# Patient Record
Sex: Female | Born: 1985 | Race: Black or African American | Hispanic: No | Marital: Married | Smoking: Former smoker
Health system: Southern US, Community
[De-identification: ages and names within clinical notes are randomized; demographics above are authoritative.]

## PROBLEM LIST (undated history)

## (undated) DIAGNOSIS — O24419 Gestational diabetes mellitus in pregnancy, unspecified control: Secondary | ICD-10-CM

## (undated) DIAGNOSIS — F329 Major depressive disorder, single episode, unspecified: Secondary | ICD-10-CM

## (undated) DIAGNOSIS — O139 Gestational [pregnancy-induced] hypertension without significant proteinuria, unspecified trimester: Secondary | ICD-10-CM

## (undated) DIAGNOSIS — F32A Depression, unspecified: Secondary | ICD-10-CM

## (undated) HISTORY — DX: Gestational diabetes mellitus in pregnancy, unspecified control: O24.419

## (undated) HISTORY — DX: Depression, unspecified: F32.A

## (undated) HISTORY — DX: Gestational (pregnancy-induced) hypertension without significant proteinuria, unspecified trimester: O13.9

## (undated) HISTORY — DX: Major depressive disorder, single episode, unspecified: F32.9

---
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
## 2007-01-24 ENCOUNTER — Emergency Department (HOSPITAL_COMMUNITY): Admission: EM | Admit: 2007-01-24 | Discharge: 2007-01-24 | Payer: Self-pay | Admitting: Emergency Medicine

## 2007-05-29 ENCOUNTER — Emergency Department (HOSPITAL_COMMUNITY): Admission: EM | Admit: 2007-05-29 | Discharge: 2007-05-29 | Payer: Self-pay | Admitting: Emergency Medicine

## 2007-07-22 ENCOUNTER — Emergency Department (HOSPITAL_COMMUNITY): Admission: EM | Admit: 2007-07-22 | Discharge: 2007-07-22 | Payer: Self-pay | Admitting: Emergency Medicine

## 2007-09-22 ENCOUNTER — Emergency Department (HOSPITAL_COMMUNITY): Admission: EM | Admit: 2007-09-22 | Discharge: 2007-09-23 | Payer: Self-pay | Admitting: Emergency Medicine

## 2008-03-18 ENCOUNTER — Emergency Department (HOSPITAL_COMMUNITY): Admission: EM | Admit: 2008-03-18 | Discharge: 2008-03-18 | Payer: Self-pay | Admitting: Emergency Medicine

## 2008-04-26 ENCOUNTER — Ambulatory Visit: Payer: Self-pay | Admitting: Internal Medicine

## 2008-04-26 ENCOUNTER — Observation Stay (HOSPITAL_COMMUNITY): Admission: EM | Admit: 2008-04-26 | Discharge: 2008-04-27 | Payer: Self-pay | Admitting: Emergency Medicine

## 2010-06-08 LAB — URINALYSIS, ROUTINE W REFLEX MICROSCOPIC
Bilirubin Urine: NEGATIVE
Glucose, UA: NEGATIVE mg/dL
Ketones, ur: NEGATIVE mg/dL
Protein, ur: NEGATIVE mg/dL
Specific Gravity, Urine: 1.017 (ref 1.005–1.030)
Urobilinogen, UA: 0.2 mg/dL (ref 0.0–1.0)
pH: 7 (ref 5.0–8.0)

## 2010-06-08 LAB — BLOOD GAS, ARTERIAL
Acid-base deficit: 1.5 mmol/L (ref 0.0–2.0)
Bicarbonate: 22.3 mEq/L (ref 20.0–24.0)
Drawn by: 277341

## 2010-06-08 LAB — BASIC METABOLIC PANEL
CO2: 26 mEq/L (ref 19–32)
CO2: 27 mEq/L (ref 19–32)
Calcium: 7.6 mg/dL — ABNORMAL LOW (ref 8.4–10.5)
Calcium: 7.9 mg/dL — ABNORMAL LOW (ref 8.4–10.5)
Calcium: 8.3 mg/dL — ABNORMAL LOW (ref 8.4–10.5)
GFR calc Af Amer: >60 mL/min (ref >60)
GFR calc Af Amer: >60 mL/min (ref >60)
GFR calc non Af Amer: >60 mL/min (ref >60)
Glucose, Bld: 102 mg/dL — ABNORMAL HIGH (ref 70–99)
Glucose, Bld: 92 mg/dL (ref 70–99)
Glucose, Bld: 97 mg/dL (ref 70–99)
Potassium: 2.8 mEq/L — ABNORMAL LOW (ref 3.5–5.1)
Potassium: 3.2 mEq/L — ABNORMAL LOW (ref 3.5–5.1)
Potassium: 3.5 mEq/L (ref 3.5–5.1)
Sodium: 137 mEq/L (ref 135–145)
Sodium: 139 mEq/L (ref 135–145)
Sodium: 141 mEq/L (ref 135–145)

## 2010-06-08 LAB — CBC
HCT: 41.7 % (ref 36.0–46.0)
Hemoglobin: 11.7 g/dL — ABNORMAL LOW (ref 12.0–15.0)
MCHC: 33.3 g/dL (ref 30.0–36.0)
MCHC: 33.8 g/dL (ref 30.0–36.0)
MCV: 90.9 fL (ref 78.0–100.0)
MCV: 91.3 fL (ref 78.0–100.0)
WBC: 7.3 10*3/uL (ref 4.0–10.5)

## 2010-06-08 LAB — RAPID URINE DRUG SCREEN, HOSP PERFORMED
Amphetamines: NOT DETECTED
Benzodiazepines: NOT DETECTED
Opiates: POSITIVE — AB
Tetrahydrocannabinol: POSITIVE — AB

## 2010-06-08 LAB — DIFFERENTIAL
Basophils Relative: 0 % (ref 0–1)
Eosinophils Absolute: 0.2 10*3/uL (ref 0.0–0.7)
Eosinophils Relative: 2 % (ref 0–5)
Lymphs Abs: 0.9 10*3/uL (ref 0.7–4.0)
Monocytes Absolute: 0.5 10*3/uL (ref 0.1–1.0)
Monocytes Relative: 7 % (ref 3–12)
Neutrophils Relative %: 78 % — ABNORMAL HIGH (ref 43–77)

## 2010-06-08 LAB — COMPREHENSIVE METABOLIC PANEL
AST: 19 U/L (ref 0–37)
Alkaline Phosphatase: 71 U/L (ref 39–117)
BUN: 11 mg/dL (ref 6–23)
CO2: 26 mEq/L (ref 19–32)
Calcium: 8.9 mg/dL (ref 8.4–10.5)
Creatinine, Ser: 0.98 mg/dL (ref 0.4–1.2)
GFR calc Af Amer: >60 mL/min (ref >60)
GFR calc non Af Amer: >60 mL/min (ref >60)
Glucose, Bld: 100 mg/dL — ABNORMAL HIGH (ref 70–99)
Sodium: 142 mEq/L (ref 135–145)
Total Protein: 7.6 g/dL (ref 6.0–8.3)

## 2010-06-08 LAB — POCT I-STAT 3, ART BLOOD GAS (G3+)
pCO2 arterial: 31.7 mmHg — ABNORMAL LOW (ref 35.0–45.0)
pH, Arterial: 7.418 — ABNORMAL HIGH (ref 7.350–7.400)

## 2010-06-08 LAB — OSMOLALITY: Osmolality: 287 mOsm/kg (ref 275–300)

## 2010-06-08 LAB — TRICYCLICS SCREEN, URINE: TCA Scrn: NOT DETECTED

## 2010-07-14 NOTE — Discharge Summary (Signed)
Carrie Haley, Carrie Haley                  ACCOUNT NO.:  [id number]   MEDICAL RECORD NO.:  [id number]          PATIENT TYPE:  OBV   LOCATION:  6713                         FACILITY:  [facility]   PHYSICIAN:  Madaline Guthrie, M.D.    DATE OF BIRTH:  Oct 08, 1985   DATE OF ADMISSION:  04/26/2008  DATE OF DISCHARGE:  04/27/2008                               DISCHARGE SUMMARY   DISCHARGE DIAGNOSES:  1. Salicylate intoxication.  2. Dysmenorrhea.  3. Hypertension, taken off meds because the blood pressure normalized.   DISCHARGE MEDICATIONS:  None.   CONDITION AT DISCHARGE:  The patient did not have any adverse effects of  salicylate intoxication.  The patient was instructed on proper usage of  aspirin.  We will have the patient follow up at [facility].  The patient will be called with appointment.   PROCEDURES:  None.   HISTORY OF PRESENT ILLNESS:  A 25 year old woman with past medical  history of hypertension and dysmenorrhea admitted via the ED for aspirin  overdose.  The patient was in her usual state of health until day before  admission during the afternoon when she developed right-sided headache.  At that time, the patient had been drinking 24 ounces of beer.  The  patient decided to start taking aspirin 325 mg for relief of headache.  The patient is unable to recall how many aspirin she took.  The patient  went to bed.  Upon awakening up on the morning of admission, the patient  was experiencing ringing of her ears and nausea.  The patient vomited  once, bringing up food, no blood.  Also, noticed epigastric pain,  burning in nature, nonradiating.  No bowel-related problems such as  diarrhea.  No shortness of breath.  No chest pain.  No headaches on  admission.  No seizures.  No loss of consciousness.  The patient denied  suicidal intention.   PHYSICAL EXAMINATION:  VITAL SIGNS:  Temperature 96.9, blood pressure  156/87, pulse 90, respiratory rate 16, and oxygen saturation 99  on room  air.  GENERAL:  NAD.  EYES:  EOMI, PERRLA, no icterus.  ENT:  Moist mucous membranes.  NECK:  Supple.  RESPIRATORY:  Clear to auscultation bilaterally.  CARDIOVASCULAR:  Regular rate and rhythm.  No murmurs, rubs, or gallops.  GI:  Soft.  Bowel sounds positive.  Nontender and nondistended.  EXTREMITIES:  No edema.  GENITOURINARY:  No CVA tenderness bilaterally.  SKIN:  Normal turgor.  No rash.  LYMPH NODES:  No lymphadenopathy.  MUSCULOSKELETAL:  Normal range of motions.  NEUROLOGICAL:  Alert and oriented x3, nonfocal.   ADMISSION LABORATORY DATA:  Sodium 142, potassium 3.8, chloride 108,  bicarb 26, BUN 11, creatinine 0.98, and glucose 100.  White blood cells  7.3, hemoglobin 13.9, and platelets 389.  Salicylate level 34.3.   HOSPITAL COURSE:  1. Salicylate overdose.  The patient was admitted for close      observation.  The patient was treated with activated charcoal and      alkalinization of urine.  The patient's epigastric pain was  appropriately managed with pain medications.  The patient did not      develop any acidosis or renal failure due to salicylate      intoxication.  Electrolytes were monitored throughout the      hospitalization and showed that potassium was low and thus she was      given appropriate replenishment of potassium which corrected by the      time the patient was discharged.  On the day of discharge, the      patient's salicylate level came down to 14.5.  The patient was      instructed on proper usage of aspirin.  2. Depression.  Although the patient states not to have any suicidal      intentions, the patient did experience some depression.  The      patient did not have any plans of suicide.  The patient did not      want to talk to a professional about depression.  The patient      agreed that she would look for help as an outpatient basis.  The      patient will continue to follow up at [facility] for      further  management.  3. Tobacco abuse.  Smoking cessation consult was done while during the      hospitalization.  The patient was recommended to stop cigarette      smoking and consequences of such were explained.   DISCHARGE LABORATORY DATA:  White blood cells 6.5, hemoglobin 11.7,  hematocrit 34.6, and platelets 314.  Sodium 139, potassium 3.5, chloride  108, bicarb 27, glucose 96, BUN 6, and creatinine 0.77.   DISCHARGE VITAL SIGNS:  Temperature 97.7, blood pressure 133/71, pulse  76, respiratory rate 18, and oxygen saturation 98 on room air.      [name], MD  Electronically Signed      Madaline Guthrie, M.D.  Electronically Signed    RV/MEDQ  D:  04/29/2008  T:  04/30/2008  Job:  [id number]

## 2010-09-29 ENCOUNTER — Ambulatory Visit (INDEPENDENT_AMBULATORY_CARE_PROVIDER_SITE_OTHER): Payer: Self-pay | Admitting: Obstetrics & Gynecology

## 2010-09-29 ENCOUNTER — Encounter: Payer: Self-pay | Admitting: Obstetrics & Gynecology

## 2010-09-29 ENCOUNTER — Other Ambulatory Visit: Payer: Self-pay | Admitting: Obstetrics & Gynecology

## 2010-09-29 VITALS — BP 143/80 | HR 73 | Temp 97.9°F | Ht 66.0 in | Wt 221.0 lb

## 2010-09-29 DIAGNOSIS — L680 Hirsutism: Secondary | ICD-10-CM

## 2010-09-29 LAB — FOLLICLE STIMULATING HORMONE: FSH: 4.8 m[IU]/mL

## 2010-09-29 LAB — HEMOGLOBIN A1C
Hgb A1c MFr Bld: 5.5 % (ref <5.7)
Mean Plasma Glucose: 111 mg/dL (ref <117)

## 2010-09-29 MED ORDER — SPIRONOLACTONE 25 MG PO TABS: 50.0000 mg | ORAL_TABLET | Freq: Every day | ORAL | Status: DC

## 2010-09-29 NOTE — Progress Notes (Signed)
Patient is here today because she is worried about her hirsutism and possible PCOS diagnosis.  Denies irregular menses, no other GYN symptoms.  Followed at the [facility]. On exam, noted hirsutism in neck and chest, also noted acne in same areas. Plan is to obtain PCOS evaluation labs, ultrasound.  Also gave Rx for OCPs and spironolactone.  Followup in 2-3 weeks.  Jsiah Menta A 09/29/2010

## 2010-09-29 NOTE — Patient Instructions (Addendum)
Polycystic Ovarian Syndrome (PCOS)  Treatments Women with PCOS have an increase of female hormones (androgens) and do not produce an egg (ovulate). If you have PCOS, you may not have a menstrual period for 3 to 6 months or none at all. PCOS also increases the risk of type 2 diabetes, heart disease, increase blood pressure, infertility, obesity, acne, female type baldness and increase hair growth on the face and chest.  There is no cure for PCOS. Therefore, it needs to be managed to prevent problems and treat the patient's symptoms. Treatment is also based on whether the woman wants to have a baby or needs contraception. Your caregiver will go over some of the choices with you.  TREATMENT The following are choices your caregiver may consider discussing with you.   Diabetes Medications. Women with PCOS should be screened for diabetes and treated if necessary. Managing diabetes mellitus is important.   Maintain a healthy diet and exercise as directed by your caregiver. Since obesity is common with PCOS, a healthy diet and exercise will help the body lower glucose levels, use insulin better and may help restore a normal period. Even loss of 10% body weight can help make a woman's cycle more regular.   Birth control pills. For women who do not want to become pregnant, birth control pills (combination estrogen and progesterone) can regulate menstrual cycles. They can also reduce female hormone levels, help to clear up acne and prevent baldness and increase hair growth. Birth control pills do not cure PCOS. The menstrual cycle will become abnormal again if the pill is stopped. Women may also think about taking a pill that only has the female hormone (progesterone). This will regulate the menstrual cycle and prevent endometrial (lining of the inside of the uterus) problems. Progesterone alone can make acne and hair growth worse.   Medicine for increased hair growth or extra female hormones. If a woman is not trying to  get pregnant, there are some other medicines that may reduce hair growth. A type of blood pressure medicine has been shown to decrease the female hormone's effect on hair. Propecia, a medicine taken by men for hair loss, is another medication that blocks this effect. Both of these medicines can affect the development of a female fetus. They should not be taken if pregnancy is possible. Other non-medical treatments such as electrolysis or laser hair removal are effective at getting rid of hair. A woman with PCOS can also take hormonal treatment to keep new hair from growing   Fertility Medications. The main fertility problem for women with PCOS is the lack of ovulation. Even though a woman is diagnosed with PCOS, her husband's sperm count should be checked. The woman's fallopian tubes should be checked to make sure they are open (the usual infertility work up) before fertility medications are used. Pills and shots can be used to stimulate ovulation. PCOS patients are at increased risk for multiple births when using these medications. In vitro fertilization (IVF) is sometimes recommended to control the chance of having triplets or more.   Surgery. Surgery is not recommended as the first course of treatment. However, surgery called ovarian drilling is available to induce ovulation. The caregiver makes a very small cut (incision) above or below the belly button (navel). They will insert a small, telescope-like instrument into the belly (abdomen). This is called laparoscopy. The caregiver punctures the thick capsule of the ovary (that prevents ovulation) with a small needle carrying an electric current. This destroys a small  portion of the ovary. This procedure carries a risk of developing scar tissue on the ovary. This surgery can lower female hormone levels and help with ovulation. The effects may only last a few months. This treatment does not help with increased hair growth and loss of scalp hair.  EFFECTS ON  PREGNANCY There appears to be a higher rate of miscarriage, gestational diabetes, pregnancy-induced high blood pressure, and premature delivery in women with PCOS. Research indicates that the drug Metformin is safe in pregnancy. They are also looking at how the drug lowers female hormone levels and limits weight gain in women who are obese when they get pregnant.  PCOS CHANGES AT MENOPAUSE Researchers think that as women reach menopause, ovarian function changes. The menstrual cycle may become more normal. But even with falling female hormone levels, excessive hair growth continues and female pattern baldness or thinning hair gets worse after menopause. FOR MORE INFORMATION You can find out more about PCOS by contacting:  [facility] Information Center [facility])  Phone: [phone])  American Association of Clinical Endocrinologists (AACE)  Phone: (513) [phone]  Web: www.aace.com  Center for Applied Reproductive Science (CARS) Phone: 8257714054  Web: www.ivf-et.com  PolyCystic Ovarian Syndrome Association, Inc. (PCOSA) Phone: ([phone])581-9586  Web: www.pcosupport.[employer] and Merchandiser, retail ([employer]IC[employer]), NIH, HHS Phone: [phone]  Web: CouponChronicle.com.au  The Hormone Foundation Phone: [phone]  Web: www.hormone.org  American Society for Reproductive Medicine (ASRM)  Phone: [phone]  Web: www.asrm.Administrator, sports on [employer]. ([employer])  Phone: [phone]  Web: www.inciid.org  Document Released: 06/08/2004 Document Re-Released: 08/02/2009 [facility] Patient Information 2011 [place], [place].  Treatment Plan Patient needs [facility] to get OCPs for treatment of hirsutism, possible PCOS.  Any generic OCP will be sufficient Will also get prescription for Spironolactone

## 2010-10-02 LAB — DHEA-SULFATE: DHEA-SO4: 141 ug/dL (ref 35–430)

## 2010-10-02 LAB — TESTOSTERONE, FREE, TOTAL, SHBG: Sex Hormone Binding: 39 nmol/L (ref 18–114)

## 2010-10-03 ENCOUNTER — Ambulatory Visit (HOSPITAL_COMMUNITY)
Admission: RE | Admit: 2010-10-03 | Discharge: 2010-10-03 | Disposition: A | Discharge status: Home / Self Care | Payer: Self-pay | Source: Ambulatory Visit | Attending: Obstetrics & Gynecology | Admitting: Obstetrics & Gynecology

## 2010-10-03 DIAGNOSIS — N838 Other noninflammatory disorders of ovary, fallopian tube and broad ligament: Secondary | ICD-10-CM | POA: Insufficient documentation

## 2010-10-03 DIAGNOSIS — L680 Hirsutism: Secondary | ICD-10-CM

## 2010-10-03 DIAGNOSIS — E282 Polycystic ovarian syndrome: Secondary | ICD-10-CM | POA: Insufficient documentation

## 2010-11-21 LAB — CBC
HCT: 36.1
MCV: 87.6
Platelets: 412 — ABNORMAL HIGH
RBC: 4.12
WBC: 6.6

## 2010-11-21 LAB — COMPREHENSIVE METABOLIC PANEL
ALT: 11
CO2: 28
Chloride: 100
GFR calc Af Amer: >60
Potassium: 3.7
Total Protein: 6.9

## 2010-11-21 LAB — DIFFERENTIAL
Lymphocytes Relative: 38
Lymphs Abs: 2.5
Monocytes Relative: 9
Neutro Abs: 3.3
Neutrophils Relative %: 50

## 2010-11-21 LAB — URINALYSIS, ROUTINE W REFLEX MICROSCOPIC
Glucose, UA: NEGATIVE
Hgb urine dipstick: NEGATIVE
Ketones, ur: NEGATIVE
Protein, ur: NEGATIVE

## 2010-12-05 LAB — INFLUENZA A+B VIRUS AG-DIRECT(RAPID)
Inflenza A Ag: NEGATIVE
Influenza B Ag: NEGATIVE

## 2010-12-11 LAB — URINALYSIS, ROUTINE W REFLEX MICROSCOPIC
Protein, ur: NEGATIVE
Urobilinogen, UA: 1

## 2010-12-11 LAB — D-DIMER, QUANTITATIVE: D-Dimer, Quant: 0.45

## 2010-12-12 ENCOUNTER — Other Ambulatory Visit: Payer: Self-pay | Admitting: Obstetrics & Gynecology

## 2010-12-12 DIAGNOSIS — E282 Polycystic ovarian syndrome: Secondary | ICD-10-CM

## 2010-12-12 DIAGNOSIS — E288 Other ovarian dysfunction: Secondary | ICD-10-CM

## 2011-01-29 ENCOUNTER — Other Ambulatory Visit: Payer: Self-pay | Admitting: Obstetrics & Gynecology

## 2011-01-29 DIAGNOSIS — L680 Hirsutism: Secondary | ICD-10-CM | POA: Insufficient documentation

## 2011-01-29 MED ORDER — SPIRONOLACTONE 50 MG PO TABS: 50.0000 mg | ORAL_TABLET | Freq: Two times a day (BID) | ORAL | Status: DC

## 2011-01-29 NOTE — Progress Notes (Signed)
Patient called and wanted Spironolactone dose increased.  Increased to 50 mg bid, may go as high as 100 mg bid if needed for treatment of her hirsutism.  Patient is also on OCPs.  Will monitor response and adjust regimen accordingly. Patient also informed that her labs and ultrasound were negative and did not meet criteria for PCOS diagnosis. She just has hirsutism for now; patient reassured.  Jaynie Collins, M.D. 01/29/2011 9:30 AM

## 2011-06-26 ENCOUNTER — Other Ambulatory Visit: Payer: Self-pay | Admitting: Obstetrics & Gynecology

## 2012-04-27 ENCOUNTER — Other Ambulatory Visit: Payer: Self-pay | Admitting: Obstetrics & Gynecology

## 2012-06-14 ENCOUNTER — Other Ambulatory Visit: Payer: Self-pay | Admitting: Obstetrics & Gynecology

## 2012-06-15 ENCOUNTER — Encounter (HOSPITAL_COMMUNITY): Payer: Self-pay | Admitting: Emergency Medicine

## 2012-06-15 ENCOUNTER — Emergency Department (HOSPITAL_COMMUNITY)
Admission: EM | Admit: 2012-06-15 | Discharge: 2012-06-15 | Disposition: A | Discharge status: Home / Self Care | Payer: Self-pay | Attending: Emergency Medicine | Admitting: Emergency Medicine

## 2012-06-15 DIAGNOSIS — Z79899 Other long term (current) drug therapy: Secondary | ICD-10-CM | POA: Insufficient documentation

## 2012-06-15 DIAGNOSIS — F17200 Nicotine dependence, unspecified, uncomplicated: Secondary | ICD-10-CM | POA: Insufficient documentation

## 2012-06-15 DIAGNOSIS — R059 Cough, unspecified: Secondary | ICD-10-CM | POA: Insufficient documentation

## 2012-06-15 DIAGNOSIS — J029 Acute pharyngitis, unspecified: Secondary | ICD-10-CM | POA: Insufficient documentation

## 2012-06-15 DIAGNOSIS — R05 Cough: Secondary | ICD-10-CM | POA: Insufficient documentation

## 2012-06-15 DIAGNOSIS — Z8659 Personal history of other mental and behavioral disorders: Secondary | ICD-10-CM | POA: Insufficient documentation

## 2012-06-15 DIAGNOSIS — J3489 Other specified disorders of nose and nasal sinuses: Secondary | ICD-10-CM | POA: Insufficient documentation

## 2012-06-15 NOTE — ED Notes (Signed)
Pt c/o sore throat x 3 days worse today; pt unsure of fever

## 2012-06-15 NOTE — ED Notes (Signed)
Went to room pt. Had left without instructions. Will call and leave message dc instructions at Nurse First Desk

## 2012-06-15 NOTE — ED Provider Notes (Signed)
History     CSN: [id number]  Arrival date & time 06/15/12  0711   First MD Initiated Contact with Patient 06/15/12 [phone]      Chief Complaint  Patient presents with  . Sore Throat    (Consider location/radiation/quality/duration/timing/severity/associated sxs/prior treatment) HPI Pt presenting with sore throat over the past 3 days, she states it was worse this morning so she decided to come to the ED.  No fever.  Has had some mild nasal congestion and cough.  No difficulty breathing or swallowing.  No vomiting, has been able to keep down liquids well.  No abdominal pain or headache.  No rash.  She has not had any treatments at home for her symptoms. There are no other associated systemic symptoms, there are no other alleviating or modifying factors.   Past Medical History  Diagnosis Date  . Depression     History reviewed. No pertinent past surgical history.  Family History  Problem Relation Age of Onset  . Hypertension Mother   . Diabetes Mother   . Asthma Father   . Cancer Father   . Diabetes Father   . Hepatitis Father     Hep B    History  Substance Use Topics  . Smoking status: Current Every Day Smoker -- 0.25 packs/day for 3 years    Types: Cigarettes  . Smokeless tobacco: Never Used  . Alcohol Use: Yes     Comment: OCC    OB History   Grav Para Term Preterm Abortions TAB SAB Ect Mult Living   0 0 0 0 0 0 0 0 0 0       Review of Systems ROS reviewed and all otherwise negative except for mentioned in HPI  Allergies  Review of patient's allergies indicates no known allergies.  Home Medications   Current Outpatient Rx  Name  Route  Sig  Dispense  Refill  . spironolactone (ALDACTONE) 50 MG tablet   Oral   Take 1 tablet (50 mg total) by mouth 2 (two) times daily.   60 tablet   3     BP 142/79  Pulse 83  Temp(Src) 98 F (36.7 C) (Oral)  Resp 18  SpO2 97% Vitals reviewed Physical Exam Physical Examination: General appearance - alert, well  appearing, and in no distress Mental status - alert, oriented to person, place, and time Eyes - pupils equal and reactive, extraocular eye movements intact Mouth - MMM, OP with mild erythema, palate symmetric, uvula midline, no exudate Neck - supple, no significant adenopathy Chest - clear to auscultation, no wheezes, rales or rhonchi, symmetric air entry Heart - normal rate, regular rhythm, normal S1, S2, no murmurs, rubs, clicks or gallops Extremities - peripheral pulses normal, no pedal edema, no clubbing or cyanosis Skin - normal coloration and turgor, no rashes  ED Course  Procedures (including critical care time)  Labs Reviewed  RAPID STREP SCREEN   No results found.   1. Viral pharyngitis       MDM  Pt preseting with sore throat and mild cough. Rapid strep negative.  Suspect viral infection, no evidence of PTA or other acute emergent condition requiring further intervention in the ED.  D/w patient symptomatic treatment.  Discharged with strict return precautions.  Pt agreeable with plan.        Ethelda Chick, MD 06/15/12 [phone]

## 2012-06-27 ENCOUNTER — Encounter (HOSPITAL_COMMUNITY): Payer: Self-pay | Admitting: Adult Health

## 2012-06-27 ENCOUNTER — Emergency Department (HOSPITAL_COMMUNITY)
Admission: EM | Admit: 2012-06-27 | Discharge: 2012-06-27 | Disposition: A | Discharge status: Home / Self Care | Payer: Self-pay | Attending: Emergency Medicine | Admitting: Emergency Medicine

## 2012-06-27 DIAGNOSIS — R21 Rash and other nonspecific skin eruption: Secondary | ICD-10-CM | POA: Insufficient documentation

## 2012-06-27 DIAGNOSIS — T7840XA Allergy, unspecified, initial encounter: Secondary | ICD-10-CM

## 2012-06-27 DIAGNOSIS — F17200 Nicotine dependence, unspecified, uncomplicated: Secondary | ICD-10-CM | POA: Insufficient documentation

## 2012-06-27 DIAGNOSIS — Z8659 Personal history of other mental and behavioral disorders: Secondary | ICD-10-CM | POA: Insufficient documentation

## 2012-06-27 MED ORDER — DIPHENHYDRAMINE HCL 50 MG/ML IJ SOLN
25.0000 mg | Freq: Once | INTRAMUSCULAR | Status: AC
  Administered 2012-06-27: 25 mg via INTRAVENOUS
  Filled 2012-06-27: qty 1

## 2012-06-27 MED ORDER — METHYLPREDNISOLONE SODIUM SUCC 125 MG IJ SOLR
125.0000 mg | Freq: Once | INTRAMUSCULAR | Status: AC
  Administered 2012-06-27: 125 mg via INTRAVENOUS
  Filled 2012-06-27: qty 2

## 2012-06-27 NOTE — ED Notes (Signed)
Pt discharged.Vital signs stable and GCS 15 

## 2012-06-27 NOTE — ED Notes (Addendum)
Presents with rash to bilateral upper extremities that began 30 minutes ago after using tea tree oil. Denies SOB, c/o nausea.  oxygen 99% RA. Took 25 mg benadryl at home. And placed calamine lotion on rash. Pt is tachycardic 110s. Bilateral lung sounds clear.

## 2012-06-27 NOTE — ED Provider Notes (Signed)
History     CSN: [id number]  Arrival date & time 06/27/12  0021   First MD Initiated Contact with Patient 06/27/12 0106      Chief Complaint  Patient presents with  . Allergic Reaction    (Consider location/radiation/quality/duration/timing/severity/associated sxs/prior treatment) Patient is a 27 y.o. female presenting with allergic reaction. The history is provided by the patient (the pt complains of a puritic rash to her arms). No language interpreter was used.  Allergic Reaction The primary symptoms are  rash. The primary symptoms do not include wheezing, shortness of breath, cough, abdominal pain or diarrhea. The current episode started 3 to 5 hours ago. The problem has been rapidly improving. This is a new problem.  Associated with: nothing. Significant symptoms that are not present include eye redness.    Past Medical History  Diagnosis Date  . Depression     History reviewed. No pertinent past surgical history.  Family History  Problem Relation Age of Onset  . Hypertension Mother   . Diabetes Mother   . Asthma Father   . Cancer Father   . Diabetes Father   . Hepatitis Father     Hep B    History  Substance Use Topics  . Smoking status: Current Every Day Smoker -- 0.25 packs/day for 3 years    Types: Cigarettes  . Smokeless tobacco: Never Used  . Alcohol Use: Yes     Comment: OCC    OB History   Grav Para Term Preterm Abortions TAB SAB Ect Mult Living   0 0 0 0 0 0 0 0 0 0       Review of Systems  Constitutional: Negative for appetite change and fatigue.  HENT: Negative for congestion, sinus pressure and ear discharge.   Eyes: Negative for discharge and redness.  Respiratory: Negative for cough, shortness of breath and wheezing.   Cardiovascular: Negative for chest pain.  Gastrointestinal: Negative for abdominal pain and diarrhea.  Genitourinary: Negative for frequency and hematuria.  Musculoskeletal: Negative for back pain.  Skin: Positive for rash.   Neurological: Negative for seizures and headaches.  Psychiatric/Behavioral: Negative for hallucinations.    Allergies  Review of patient's allergies indicates no known allergies.  Home Medications   Current Outpatient Rx  Name  Route  Sig  Dispense  Refill  . spironolactone (ALDACTONE) 50 MG tablet   Oral   Take 100 mg by mouth daily.           BP 121/58  Pulse 85  Temp(Src) 98.6 F (37 C)  Resp 25  SpO2 97%  Physical Exam  Constitutional: She is oriented to person, place, and time. She appears well-developed.  HENT:  Head: Normocephalic.  Eyes: Conjunctivae and EOM are normal. No scleral icterus.  Neck: Neck supple. No thyromegaly present.  Cardiovascular: Normal rate and regular rhythm.  Exam reveals no gallop and no friction rub.   No murmur heard. Pulmonary/Chest: No stridor. She has no wheezes. She has no rales. She exhibits no tenderness.  Abdominal: She exhibits no distension. There is no tenderness. There is no rebound.  Musculoskeletal: Normal range of motion. She exhibits no edema.  Lymphadenopathy:    She has no cervical adenopathy.  Neurological: She is oriented to person, place, and time. Coordination normal.  Skin: Rash noted. No erythema.  Rash to both forearms  Psychiatric: She has a normal mood and affect. Her behavior is normal.    ED Course  Procedures (including critical care time)  Labs Reviewed -  No data to display No results found.   1. Allergic reaction, initial encounter       MDM          Benny Lennert, MD 06/27/12 [phone]

## 2012-07-25 ENCOUNTER — Other Ambulatory Visit: Payer: Self-pay | Admitting: Obstetrics & Gynecology

## 2012-07-25 DIAGNOSIS — L680 Hirsutism: Secondary | ICD-10-CM

## 2013-02-22 ENCOUNTER — Encounter (HOSPITAL_COMMUNITY): Payer: Self-pay | Admitting: Emergency Medicine

## 2013-02-22 ENCOUNTER — Emergency Department (HOSPITAL_COMMUNITY)
Admission: EM | Admit: 2013-02-22 | Discharge: 2013-02-22 | Discharge status: Against Medical Advice | Payer: Medicaid Other | Attending: Emergency Medicine | Admitting: Emergency Medicine

## 2013-02-22 DIAGNOSIS — F17200 Nicotine dependence, unspecified, uncomplicated: Secondary | ICD-10-CM | POA: Insufficient documentation

## 2013-02-22 DIAGNOSIS — R05 Cough: Secondary | ICD-10-CM | POA: Insufficient documentation

## 2013-02-22 DIAGNOSIS — R509 Fever, unspecified: Secondary | ICD-10-CM | POA: Insufficient documentation

## 2013-02-22 DIAGNOSIS — R059 Cough, unspecified: Secondary | ICD-10-CM | POA: Insufficient documentation

## 2013-02-22 NOTE — ED Notes (Signed)
Reports flu like symptoms that started this am. Having cough, fever, chills, body pain, vomiting. Mask on pt at triage. No acute distress noted.

## 2013-02-26 HISTORY — PX: ABDOMINOPLASTY: SUR9

## 2013-08-04 ENCOUNTER — Other Ambulatory Visit: Payer: Self-pay | Admitting: Obstetrics & Gynecology

## 2013-08-06 ENCOUNTER — Other Ambulatory Visit: Payer: Self-pay | Admitting: Obstetrics & Gynecology

## 2013-08-20 ENCOUNTER — Other Ambulatory Visit: Payer: Self-pay | Admitting: Obstetrics & Gynecology

## 2014-04-01 ENCOUNTER — Ambulatory Visit: Payer: Medicaid Other | Attending: Internal Medicine | Admitting: Internal Medicine

## 2014-04-01 ENCOUNTER — Encounter: Payer: Self-pay | Admitting: Internal Medicine

## 2014-04-01 VITALS — BP 134/88 | HR 80 | Temp 98.4°F | Resp 16 | Ht 67.0 in | Wt 259.0 lb

## 2014-04-01 DIAGNOSIS — Z2821 Immunization not carried out because of patient refusal: Secondary | ICD-10-CM

## 2014-04-01 DIAGNOSIS — N760 Acute vaginitis: Secondary | ICD-10-CM | POA: Insufficient documentation

## 2014-04-01 DIAGNOSIS — L680 Hirsutism: Secondary | ICD-10-CM | POA: Insufficient documentation

## 2014-04-01 DIAGNOSIS — F329 Major depressive disorder, single episode, unspecified: Secondary | ICD-10-CM | POA: Insufficient documentation

## 2014-04-01 DIAGNOSIS — N920 Excessive and frequent menstruation with regular cycle: Secondary | ICD-10-CM | POA: Insufficient documentation

## 2014-04-01 DIAGNOSIS — Z0001 Encounter for general adult medical examination with abnormal findings: Secondary | ICD-10-CM | POA: Insufficient documentation

## 2014-04-01 DIAGNOSIS — F17210 Nicotine dependence, cigarettes, uncomplicated: Secondary | ICD-10-CM | POA: Insufficient documentation

## 2014-04-01 DIAGNOSIS — Z0000 Encounter for general adult medical examination without abnormal findings: Secondary | ICD-10-CM

## 2014-04-01 LAB — COMPLETE METABOLIC PANEL WITH GFR
ALK PHOS: 67 U/L (ref 39–117)
ALT: 14 U/L (ref 0–35)
AST: 16 U/L (ref 0–37)
Albumin: 3.8 g/dL (ref 3.5–5.2)
BILIRUBIN TOTAL: 0.3 mg/dL (ref 0.2–1.2)
BUN: 12 mg/dL (ref 6–23)
CHLORIDE: 104 meq/L (ref 96–112)
CO2: 28 mEq/L (ref 19–32)
Calcium: 9.6 mg/dL (ref 8.4–10.5)
Creat: 0.87 mg/dL (ref 0.50–1.10)
GFR, Est African American: >89 mL/min
Glucose, Bld: 87 mg/dL (ref 70–99)
Potassium: 4.6 mEq/L (ref 3.5–5.3)
Sodium: 139 mEq/L (ref 135–145)
TOTAL PROTEIN: 7.1 g/dL (ref 6.0–8.3)

## 2014-04-01 LAB — CBC
HCT: 41.6 % (ref 36.0–46.0)
HEMOGLOBIN: 13.3 g/dL (ref 12.0–15.0)
MCH: 28.5 pg (ref 26.0–34.0)
MCHC: 32 g/dL (ref 30.0–36.0)
MCV: 89.3 fL (ref 78.0–100.0)
MPV: 8.3 fL — AB (ref 8.6–12.4)
PLATELETS: 491 10*3/uL — AB (ref 150–400)
RBC: 4.66 MIL/uL (ref 3.87–5.11)
RDW: 13.2 % (ref 11.5–15.5)
WBC: 9.3 10*3/uL (ref 4.0–10.5)

## 2014-04-01 MED ORDER — METRONIDAZOLE 500 MG PO TABS: 500.0000 mg | ORAL_TABLET | Freq: Two times a day (BID) | ORAL | Status: DC

## 2014-04-01 NOTE — Progress Notes (Signed)
Is here to establish care. Pt is requesting to restart her anxiety medications. Pt reports having chronic lower back pain. Pt is requesting a physical and a pap smear.

## 2014-04-01 NOTE — Progress Notes (Signed)
Patient ID: Carrie Haley, female   DOB: 05-13-1985, 29 y.o.   MRN: [id number]  WUJ:[id number]  [id number]:[id number]  DOB - 01/17/1986  CC:  Chief Complaint  Patient presents with  . Establish Care       HPI: Carrie Haley is a 29 y.o. female here today to establish medical care.  Patient has a past medical history of depression, anxiety, and hirsutism.  She is requesting a physical and pap smear today.  She reports that she was seeing a GYN 4 years ago for hirsuitism and possible PCOS but has been unable to afford the follow up visits.  She has continued to take the aldactone over the years but not the OCP's. She continues to have excessive hair growth, heavier than usual bleeding (5 months). Patient is unmarried without children. Denies irregular menses, abdominal pain/cramping.   She reports that she has [facility] years ago and was on prozac. She reports at that time she did feel as if the pills were helping. She has been to [facility] and was given a prescription for Seroquel and a second medication but the cost of the medication was too much for her to afford so she never took the medication.  She states that she has been diagnosed with PTSD, bipolar, chronic depression, and anxiety over the years.  Patient has No headache, No chest pain, No abdominal pain - No Nausea, No new weakness tingling or numbness, No Cough - SOB.  No Known Allergies Past Medical History  Diagnosis Date  . Depression    Current Outpatient Prescriptions on File Prior to Visit  Medication Sig Dispense Refill  . spironolactone (ALDACTONE) 25 MG tablet TAKE TWO TABLETS BY MOUTH ONCE DAILY 60 tablet 0  . Ascorbic Acid (VITAMIN C) POWD Take 1 packet by mouth once. Emergen-C    . diphenhydrAMINE (BENADRYL) 25 MG tablet Take 25 mg by mouth once.    Marland Kitchen spironolactone (ALDACTONE) 25 MG tablet TAKE TWO TABLETS BY MOUTH ONCE DAILY (Patient not taking: Reported on 04/01/2014) 60 tablet 0  .  spironolactone (ALDACTONE) 25 MG tablet TAKE TWO TABLETS BY MOUTH ONCE DAILY (Patient not taking: Reported on 04/01/2014) 60 tablet 0   No current facility-administered medications on file prior to visit.   Family History  Problem Relation Age of Onset  . Hypertension Mother   . Diabetes Mother   . Asthma Father   . Cancer Father   . Diabetes Father   . Hepatitis Father     Hep B   History   Social History  . Marital Status: Single    Spouse Name: N/A    Number of Children: N/A  . Years of Education: N/A   Occupational History  . Not on file.   Social History Main Topics  . Smoking status: Current Every Day Smoker -- 0.25 packs/day for 3 years    Types: Cigarettes  . Smokeless tobacco: Never Used  . Alcohol Use: Yes     Comment: OCC  . Drug Use: No  . Sexual Activity: Yes    Birth Control/ Protection: None   Other Topics Concern  . Not on file   Social History Narrative    Review of Systems: Constitutional: Negative for fever, chills, diaphoresis, activity change, appetite change and fatigue. HENT: Negative for ear pain, nosebleeds, congestion, facial swelling, rhinorrhea, neck pain, neck stiffness and ear discharge.  Eyes: Negative for pain, discharge, redness, itching and visual disturbance. Respiratory: Negative for  cough, choking, chest tightness, shortness of breath, wheezing and stridor.  Cardiovascular: Negative for chest pain, palpitations and leg swelling. Gastrointestinal: Negative for abdominal distention. Genitourinary: Negative for dysuria, urgency, frequency, hematuria, flank pain, decreased urine volume, difficulty urinating and dyspareunia.  Musculoskeletal: Negative for back pain, joint swelling, arthralgia and gait problem. Neurological: Negative for dizziness, tremors, seizures, syncope, facial asymmetry, speech difficulty, weakness, light-headedness, numbness and headaches.  Hematological: Negative for adenopathy. Does not bruise/bleed  easily. Psychiatric/Behavioral: Negative for hallucinations, behavioral problems, confusion, dysphoric mood, decreased concentration and agitation.    Physical Exam: Constitutional: Patient appears well-developed and well-nourished. No distress. HENT: Normocephalic, atraumatic, External right and left ear normal. Oropharynx is clear and moist.  Eyes: Conjunctivae and EOM are normal. PERRLA, no scleral icterus. Neck: Normal ROM. Neck supple. No JVD. No tracheal deviation. No thyromegaly. CVS: RRR, S1/S2 +, no murmurs, no gallops, no carotid bruit.  Pulmonary: Effort and breath sounds normal, no stridor, rhonchi, wheezes, rales.  Abdominal: Soft. BS +, no distension, tenderness, rebound or guarding.  Musculoskeletal: Normal range of motion. No edema and no tenderness.  Lymphadenopathy: No lymphadenopathy noted, cervical, inguinal or axillary Neuro: Alert. Normal reflexes, muscle tone coordination. No cranial nerve deficit. Skin: Skin is warm and dry. No rash noted. Not diaphoretic. No erythema. No pallor. Psychiatric: Normal mood and affect. Behavior, judgment, thought content normal. Physical Exam  Pulmonary/Chest: Right breast exhibits no inverted nipple and no mass. Left breast exhibits no inverted nipple and no mass.  Genitourinary: Uterus normal. No breast tenderness or discharge. Cervix exhibits no motion tenderness, no discharge and no friability. Right adnexum displays no tenderness. Left adnexum displays no tenderness. Vaginal discharge found.  Lymphadenopathy:       Right: No inguinal adenopathy present.       Left: No inguinal adenopathy present.      Objective:   Filed Vitals:   04/01/14 1006  BP: 134/88  Pulse: 80  Temp: 98.4 F (36.9 C)  Resp: 16      Lab Results  Component Value Date   WBC 6.5 04/27/2008   HGB 11.7* 04/27/2008   HCT 34.6* 04/27/2008   MCV 90.9 04/27/2008   PLT 314 04/27/2008   Lab Results  Component Value Date   CREATININE 0.77  04/27/2008   BUN 6 04/27/2008   NA 139 04/27/2008   K 3.5 DELTA CHECK NOTED 04/27/2008   CL 108 04/27/2008   CO2 27 04/27/2008    Lab Results  Component Value Date   HGBA1C 5.5 09/29/2010   Lipid Panel  No results found for: CHOL, TRIG, HDL, CHOLHDL, VLDL, LDLCALC     Assessment and plan:   Carrie Haley was seen today for establish care.  Diagnoses and associated orders for this visit:  Annual physical exam - Cytology - PAP [place] - Cervicovaginal ancillary only - TSH - CBC - COMPLETE METABOLIC PANEL WITH GFR - Vitamin D, 25-hydroxy - HIV antibody (with reflex) - RPR  Vaginitis - Begin metroNIDAZOLE (FLAGYL) 500 MG tablet; Take 1 tablet (500 mg total) by mouth 2 (two) times daily. Explained no alcohol while on medication  Refused influenza vaccine Benefits explained   Return if symptoms worsen or fail to improve.    Holland CommonsKECK, Shallon Yaklin, NP-C [facility] [phone] 04/01/2014, 10:34 AM

## 2014-04-01 NOTE — Patient Instructions (Addendum)
Call once you get orange card so I can send referral for GYN. They will manage your hirsutism. They may repeat testing for PCOS.  Work on weight loss for back pain, may take 600 mg of Ibuprofen twice per day for back pain We are able to get Seroquel here if needed, but [facility] should have a pharmacy that helps with medication. Ask when you go. They are walk in-first come, first serve.   Bacterial Vaginosis Bacterial vaginosis is a vaginal infection that occurs when the normal balance of bacteria in the vagina is disrupted. It results from an overgrowth of certain bacteria. This is the most common vaginal infection in women of childbearing age. Treatment is important to prevent complications, especially in pregnant women, as it can cause a premature delivery. CAUSES  Bacterial vaginosis is caused by an increase in harmful bacteria that are normally present in smaller amounts in the vagina. Several different kinds of bacteria can cause bacterial vaginosis. However, the reason that the condition develops is not fully understood. RISK FACTORS Certain activities or behaviors can put you at an increased risk of developing bacterial vaginosis, including:  Having a new sex partner or multiple sex partners.  Douching.  Using an intrauterine device (IUD) for contraception. Women do not get bacterial vaginosis from toilet seats, bedding, swimming pools, or contact with objects around them. SIGNS AND SYMPTOMS  Some women with bacterial vaginosis have no signs or symptoms. Common symptoms include:  Grey vaginal discharge.  A fishlike odor with discharge, especially after sexual intercourse.  Itching or burning of the vagina and vulva.  Burning or pain with urination. DIAGNOSIS  Your health care provider will take a medical history and examine the vagina for signs of bacterial vaginosis. A sample of vaginal fluid may be taken. Your health care provider will look at this sample under a microscope to  check for bacteria and abnormal cells. A vaginal pH test may also be done.  TREATMENT  Bacterial vaginosis may be treated with antibiotic medicines. These may be given in the form of a pill or a vaginal cream. A second round of antibiotics may be prescribed if the condition comes back after treatment.  HOME CARE INSTRUCTIONS   Only take over-the-counter or prescription medicines as directed by your health care provider.  If antibiotic medicine was prescribed, take it as directed. Make sure you finish it even if you start to feel better.  Do not have sex until treatment is completed.  Tell all sexual partners that you have a vaginal infection. They should see their health care provider and be treated if they have problems, such as a mild rash or itching.  Practice safe sex by using condoms and only having one sex partner. SEEK MEDICAL CARE IF:   Your symptoms are not improving after 3 days of treatment.  You have increased discharge or pain.  You have a fever. MAKE SURE YOU:   Understand these instructions.  Will watch your condition.  Will get help right away if you are not doing well or get worse. FOR MORE INFORMATION  Centers for Disease Control and Prevention, Division of STD Prevention: SolutionApps.co.zawww.cdc.gov/std American Sexual Health Association (ASHA): www.ashastd.org  Document Released: 02/12/2005 Document Revised: 12/03/2012 Document Reviewed: 09/24/2012 [facility] Patient Information 2015 [employer], [place]. This information is not intended to replace advice given to you by your health care provider. Make sure you discuss any questions you have with your health care provider.

## 2014-04-02 ENCOUNTER — Telehealth: Payer: Self-pay | Admitting: *Deleted

## 2014-04-02 LAB — CERVICOVAGINAL ANCILLARY ONLY
Chlamydia: NEGATIVE
NEISSERIA GONORRHEA: NEGATIVE
WET PREP (BD AFFIRM): NEGATIVE
Wet Prep (BD Affirm): NEGATIVE
Wet Prep (BD Affirm): POSITIVE — AB

## 2014-04-02 LAB — HIV ANTIBODY (ROUTINE TESTING W REFLEX): HIV 1&2 Ab, 4th Generation: NONREACTIVE

## 2014-04-02 LAB — TSH: TSH: 2.603 u[IU]/mL (ref 0.350–4.500)

## 2014-04-02 LAB — RPR

## 2014-04-02 LAB — VITAMIN D 25 HYDROXY (VIT D DEFICIENCY, FRACTURES): VIT D 25 HYDROXY: 6 ng/mL — AB (ref 30–100)

## 2014-04-02 MED ORDER — VITAMIN D (ERGOCALCIFEROL) 1.25 MG (50000 UNIT) PO CAPS: 50000.0000 [IU] | ORAL_CAPSULE | ORAL | Status: DC

## 2014-04-02 NOTE — Telephone Encounter (Signed)
Pt called back, aware of lab results Stated taking antibiotic

## 2014-04-02 NOTE — Telephone Encounter (Signed)
Left voice message to return call 

## 2014-04-02 NOTE — Telephone Encounter (Signed)
-----   Message from [name], NP sent at 04/02/2014  1:04 PM EST ----- Patient positive for Bacterial Vaginosis . Please explain this is not a STD, but a imbalance of the vaginal pH. Please send Flagyl 500 mg BID for 7 days. No refills, no alcohol while on this medication.   Vitamin D is low. Please send drisdol 50,000 IU to take once weekly for 12 weeks. 12 tablets no refills. No STD's. Still waiting on cytology results.

## 2014-04-06 LAB — CYTOLOGY - PAP

## 2014-04-13 ENCOUNTER — Telehealth: Payer: Self-pay | Admitting: *Deleted

## 2014-04-13 NOTE — Telephone Encounter (Signed)
Pt aware of pap results.

## 2014-04-13 NOTE — Telephone Encounter (Signed)
-----   Message from [name], NP sent at 04/09/2014 11:07 PM EST ----- Patient pap is negative for malignancies. Will repeat in 3 years.

## 2014-04-28 ENCOUNTER — Ambulatory Visit: Payer: Medicaid Other

## 2014-06-19 ENCOUNTER — Other Ambulatory Visit: Payer: Self-pay | Admitting: Obstetrics & Gynecology

## 2014-10-18 ENCOUNTER — Other Ambulatory Visit: Payer: Self-pay | Admitting: Obstetrics & Gynecology

## 2015-06-06 ENCOUNTER — Other Ambulatory Visit: Payer: Self-pay | Admitting: Obstetrics & Gynecology

## 2016-05-16 ENCOUNTER — Other Ambulatory Visit: Payer: Self-pay | Admitting: Obstetrics & Gynecology

## 2016-05-18 ENCOUNTER — Other Ambulatory Visit: Payer: Self-pay | Admitting: Obstetrics & Gynecology

## 2017-09-24 ENCOUNTER — Encounter (HOSPITAL_COMMUNITY): Payer: Self-pay | Admitting: Emergency Medicine

## 2017-09-24 ENCOUNTER — Emergency Department (HOSPITAL_COMMUNITY): Payer: Self-pay

## 2017-09-24 ENCOUNTER — Emergency Department (HOSPITAL_COMMUNITY)
Admission: EM | Admit: 2017-09-24 | Discharge: 2017-09-25 | Disposition: A | Discharge status: Home / Self Care | Payer: Self-pay | Attending: Emergency Medicine | Admitting: Emergency Medicine

## 2017-09-24 DIAGNOSIS — F17210 Nicotine dependence, cigarettes, uncomplicated: Secondary | ICD-10-CM | POA: Insufficient documentation

## 2017-09-24 DIAGNOSIS — R079 Chest pain, unspecified: Secondary | ICD-10-CM

## 2017-09-24 DIAGNOSIS — Z79899 Other long term (current) drug therapy: Secondary | ICD-10-CM | POA: Insufficient documentation

## 2017-09-24 DIAGNOSIS — R0602 Shortness of breath: Secondary | ICD-10-CM | POA: Insufficient documentation

## 2017-09-24 DIAGNOSIS — I313 Pericardial effusion (noninflammatory): Secondary | ICD-10-CM | POA: Insufficient documentation

## 2017-09-24 DIAGNOSIS — I3139 Other pericardial effusion (noninflammatory): Secondary | ICD-10-CM

## 2017-09-24 LAB — CBC
HCT: 42.6 % (ref 36.0–46.0)
Hemoglobin: 13.6 g/dL (ref 12.0–15.0)
MCH: 29.1 pg (ref 26.0–34.0)
MCHC: 31.9 g/dL (ref 30.0–36.0)
MCV: 91.2 fL (ref 78.0–100.0)
Platelets: 451 10*3/uL — ABNORMAL HIGH (ref 150–400)
RBC: 4.67 MIL/uL (ref 3.87–5.11)
RDW: 13.5 % (ref 11.5–15.5)
WBC: 11.2 10*3/uL — AB (ref 4.0–10.5)

## 2017-09-24 LAB — I-STAT TROPONIN, ED: TROPONIN I, POC: 0 ng/mL (ref 0.00–0.08)

## 2017-09-24 LAB — BASIC METABOLIC PANEL
Anion gap: 15 (ref 5–15)
BUN: 9 mg/dL (ref 6–20)
CALCIUM: 9.6 mg/dL (ref 8.9–10.3)
CO2: 24 mmol/L (ref 22–32)
CREATININE: 0.93 mg/dL (ref 0.44–1.00)
Chloride: 97 mmol/L — ABNORMAL LOW (ref 98–111)
GFR calc Af Amer: >60 mL/min (ref >60)
Glucose, Bld: 117 mg/dL — ABNORMAL HIGH (ref 70–99)
Potassium: 3.3 mmol/L — ABNORMAL LOW (ref 3.5–5.1)
SODIUM: 136 mmol/L (ref 135–145)

## 2017-09-24 LAB — I-STAT BETA HCG BLOOD, ED (MC, WL, AP ONLY)

## 2017-09-24 NOTE — ED Triage Notes (Signed)
Pt reports left sided chest pain described as tight but non radiating.  Pt also became SOB but denies vomiting, weakness.

## 2017-09-25 ENCOUNTER — Emergency Department (HOSPITAL_COMMUNITY): Payer: Self-pay

## 2017-09-25 LAB — I-STAT TROPONIN, ED: TROPONIN I, POC: 0 ng/mL (ref 0.00–0.08)

## 2017-09-25 LAB — D-DIMER, QUANTITATIVE: D-Dimer, Quant: 0.62 ug/mL-FEU — ABNORMAL HIGH (ref 0.00–0.50)

## 2017-09-25 MED ORDER — IOPAMIDOL (ISOVUE-370) INJECTION 76%
100.0000 mL | Freq: Once | INTRAVENOUS | Status: AC | PRN
  Administered 2017-09-25: 100 mL via INTRAVENOUS

## 2017-09-25 NOTE — ED Notes (Signed)
Pt. To CT via stretcher. 

## 2017-09-25 NOTE — ED Notes (Signed)
Pt. Return from CT via stretcher. 

## 2017-09-25 NOTE — ED Provider Notes (Signed)
[facility] EMERGENCY DEPARTMENT Provider Note   CSN: [id number] Arrival date & time: 09/24/17  2239     History   Chief Complaint Chief Complaint  Patient presents with  . Chest Pain  . Shortness of Breath    HPI Carrie Haley is a 32 y.o. female.  Patient presents to the emergency department for evaluation of chest pain.  Symptoms began tonight.  She reports pain on the left side of her chest associated with shortness of breath.  Pain did not radiate.  No nausea, diaphoresis.  She reports no previous cardiac or pulmonary history.     Past Medical History:  Diagnosis Date  . Depression     Patient Active Problem List   Diagnosis Date Noted  . Hirsutism 01/29/2011    History reviewed. No pertinent surgical history.   OB History    Gravida  0   Para  0   Term  0   Preterm  0   AB  0   Living  0     SAB  0   TAB  0   Ectopic  0   Multiple  0   Live Births               Home Medications    Prior to Admission medications   Medication Sig Start Date End Date Taking? Authorizing Provider  aspirin-acetaminophen-caffeine (PAMPRIN MAX) [phone] MG tablet Take 1-2 tablets by mouth every 6 (six) hours as needed (for pain or cramping).   Yes [provider]  Chaste Tree (VITEX EXTRACT PO) Take 1 capsule by mouth daily.   Yes [provider]  EVENING PRIMROSE OIL PO Take 2 capsules by mouth daily.   Yes [provider]  metroNIDAZOLE (FLAGYL) 500 MG tablet Take 1 tablet (500 mg total) by mouth 2 (two) times daily. Patient not taking: Reported on 09/25/2017 04/01/14   Ambrose Finland, NP  spironolactone (ALDACTONE) 25 MG tablet TAKE TWO TABLETS BY MOUTH ONCE DAILY Patient not taking: Reported on 09/25/2017    Anyanwu, Jethro Bastos, MD  spironolactone (ALDACTONE) 25 MG tablet TAKE TWO TABLETS BY MOUTH ONCE DAILY Patient not taking: Reported on 09/25/2017 08/06/13   Anyanwu, Jethro Bastos, MD  spironolactone  (ALDACTONE) 25 MG tablet TAKE TWO TABLETS BY MOUTH ONCE DAILY Patient not taking: Reported on 09/25/2017 06/06/15   Anyanwu, Jethro Bastos, MD  Vitamin D, Ergocalciferol, (DRISDOL) 50000 UNITS CAPS capsule Take 1 capsule (50,000 Units total) by mouth every 7 (seven) days. Patient not taking: Reported on 09/25/2017 04/02/14   Ambrose Finland, NP    Family History Family History  Problem Relation Age of Onset  . Hypertension Mother   . Diabetes Mother   . Asthma Father   . Cancer Father   . Diabetes Father   . Hepatitis Father        Hep B    Social History Social History   Tobacco Use  . Smoking status: Current Every Day Smoker    Packs/day: 0.25    Years: 3.00    Pack years: 0.75    Types: Cigarettes  . Smokeless tobacco: Never Used  Substance Use Topics  . Alcohol use: Yes    Comment: OCC  . Drug use: No     Allergies   Patient has no known allergies.   Review of Systems Review of Systems  Respiratory: Positive for shortness of breath.   Cardiovascular: Positive for chest pain.  All other systems  reviewed and are negative.    Physical Exam Updated Vital Signs BP (!) 141/85   Pulse 79   Temp 98.2 F (36.8 C)   Resp 18   Ht 5\' 6"  (1.676 m)   Wt 113.4 kg (250 lb)   LMP 08/28/2017   SpO2 98%   BMI 40.35 kg/m   Physical Exam  Constitutional: She is oriented to person, place, and time. She appears well-developed and well-nourished. No distress.  HENT:  Head: Normocephalic and atraumatic.  Right Ear: Hearing normal.  Left Ear: Hearing normal.  Nose: Nose normal.  Mouth/Throat: Oropharynx is clear and moist and mucous membranes are normal.  Eyes: Pupils are equal, round, and reactive to light. Conjunctivae and EOM are normal.  Neck: Normal range of motion. Neck supple.  Cardiovascular: Regular rhythm, S1 normal and S2 normal. Exam reveals no gallop and no friction rub.  No murmur heard. Pulmonary/Chest: Effort normal and breath sounds normal. No respiratory  distress. She exhibits no tenderness.  Abdominal: Soft. Normal appearance and bowel sounds are normal. There is no hepatosplenomegaly. There is no tenderness. There is no rebound, no guarding, no tenderness at McBurney's point and negative Murphy's sign. No hernia.  Musculoskeletal: Normal range of motion.  Neurological: She is alert and oriented to person, place, and time. She has normal strength. No cranial nerve deficit or sensory deficit. Coordination normal. GCS eye subscore is 4. GCS verbal subscore is 5. GCS motor subscore is 6.  Skin: Skin is warm, dry and intact. No rash noted. No cyanosis.  Psychiatric: She has a normal mood and affect. Her speech is normal and behavior is normal. Thought content normal.  Nursing note and vitals reviewed.    ED Treatments / Results  Labs (all labs ordered are listed, but only abnormal results are displayed) Labs Reviewed  BASIC METABOLIC PANEL - Abnormal; Notable for the following components:      Result Value   Potassium 3.3 (*)    Chloride 97 (*)    Glucose, Bld 117 (*)    All other components within normal limits  CBC - Abnormal; Notable for the following components:   WBC 11.2 (*)    Platelets 451 (*)    All other components within normal limits  D-DIMER, QUANTITATIVE (NOT AT [facility]) - Abnormal; Notable for the following components:   D-Dimer, Quant 0.62 (*)    All other components within normal limits  I-STAT TROPONIN, ED  I-STAT BETA HCG BLOOD, ED ([facility], [facility], [facility] ONLY)  I-STAT TROPONIN, ED    EKG EKG Interpretation  Date/Time:  Tuesday September 24 2017 22:45:39 EDT Ventricular Rate:  101 PR Interval:  148 QRS Duration: 84 QT Interval:  340 QTC Calculation: 440 R Axis:   64 Text Interpretation:  Sinus tachycardia Otherwise normal ECG Confirmed by Gilda Crease [phone]) on 09/25/2017 1:19:39 AM   Radiology Dg Chest 2 View  Result Date: 09/24/2017 CLINICAL DATA:  Left-sided chest pain. EXAM: CHEST - 2 VIEW COMPARISON:   01/24/2007 FINDINGS: The cardiomediastinal contours are normal. Mild bronchial thickening and subsegmental atelectasis at the left lung base. Pulmonary vasculature is normal. No consolidation, pleural effusion, or pneumothorax. No acute osseous abnormalities are seen. IMPRESSION: Mild bronchial thickening and subsegmental atelectasis at the left lung base. Electronically Signed   By: Rubye Oaks M.D.   On: 09/24/2017 23:18   Ct Angio Chest Pe W Or Wo Contrast  Result Date: 09/25/2017 CLINICAL DATA:  Chest pain.  Elevated D-dimer. EXAM: CT ANGIOGRAPHY CHEST WITH  CONTRAST TECHNIQUE: Multidetector CT imaging of the chest was performed using the standard protocol during bolus administration of intravenous contrast. Multiplanar CT image reconstructions and MIPs were obtained to evaluate the vascular anatomy. CONTRAST:  ISOVUE-370 IOPAMIDOL (ISOVUE-370) INJECTION 76% initial injection with 71 cc Isovue 370, repeat injection with 52 cc Isovue 370 IV. COMPARISON:  Chest radiograph yesterday. FINDINGS: Cardiovascular: Despite 2 injections of IV contrast, evaluation of the pulmonary arteries is significantly limited due to contrast bolus timing on both initial and repeat exam. Examination is essentially nondiagnostic for evaluation of pulmonary embolus. Excellent aortic opacification without dissection. Heart is normal in size. Small pericardial effusion. Mediastinum/Nodes: No mediastinal or hilar adenopathy. The esophagus is decompressed. No thyroid nodule. Lungs/Pleura: Central bronchial thickening. No focal airspace disease. No pulmonary edema. No pleural effusion. Upper Abdomen: No acute findings. Musculoskeletal: There are no acute or suspicious osseous abnormalities. Review of the MIP images confirms the above findings. IMPRESSION: 1. Nondiagnostic evaluation for pulmonary embolus due to contrast bolus timing, despite repeat injection, as well as soft tissue attenuation from habitus. 2. Small pericardial  effusion.  Bronchial thickening. Electronically Signed   By: Rubye Oaks M.D.   On: 09/25/2017 04:18    Procedures Procedures (including critical care time)  Medications Ordered in ED Medications  iopamidol (ISOVUE-370) 76 % injection 100 mL (100 mLs Intravenous Contrast Given 09/25/17 0254)     Initial Impression / Assessment and Plan / ED Course  I have reviewed the triage vital signs and the nursing notes.  Pertinent labs & imaging results that were available during my care of the patient were reviewed by me and considered in my medical decision making (see chart for details).     Patient presents to the emergency department for evaluation of chest pain shortness of breath.  Etiology is unclear at this time.  She does not have any significant cardiac risk factors other than obesity.  EKG was normal other than sinus tachycardia.  Troponin has been negative x2.  It is not felt that she is experiencing acute coronary syndrome or cardiac etiology of her chest pain.  Pain has resolved here in the ER without any intervention.  The aortic dissection and PE are felt to be unlikely.  She did, however, have mild tachycardia at arrival.  I therefore could not apply the [facility] rule to her.  She had a d-dimer performed which was slightly elevated.  She therefore underwent CT angiography of chest.  Unfortunately, despite 2 attempts, bolus timing was not adequate to evaluate pulmonary arteries.  Aorta is normal, no dissection.  Findings of the CT scan were reviewed with the patient.  I recommended VQ scan or possibly bilateral venous duplex of the legs.  Patient reports that for the last several hours she has felt fine without any symptoms.  She does not wish to stay in the ER any longer to have any further tests.  At this point since her symptoms have resolved, I feel that this is reasonable.  She was counseled that she can return to the ER immediately if she has any recurrence of her symptoms.  There  was a small pericardial effusion seen on CT scan.  Etiology and significance of this is unclear.  It certainly does not explain the patient's symptoms, she does not have a large enough effusion to cause shortness of breath or Tamponade.  Will refer to cardiology outpatient for further evaluation.  Final Clinical Impressions(s) / ED Diagnoses   Final diagnoses:  Chest pain,  unspecified type  Pericardial effusion    ED Discharge Orders    None       Gilda CreasePollina, Wissam Resor J, MD 09/25/17 [id number]

## 2017-09-25 NOTE — ED Notes (Signed)
Pt. Ambulated to bathroom with steady gait.  

## 2018-10-25 LAB — OB RESULTS CONSOLE HEPATITIS B SURFACE ANTIGEN: Hepatitis B Surface Ag: NEGATIVE

## 2018-10-25 LAB — OB RESULTS CONSOLE RUBELLA ANTIBODY, IGM: Rubella: IMMUNE

## 2018-10-25 LAB — OB RESULTS CONSOLE GC/CHLAMYDIA
Chlamydia: NEGATIVE
Gonorrhea: NEGATIVE

## 2019-02-24 LAB — OB RESULTS CONSOLE HIV ANTIBODY (ROUTINE TESTING): HIV: NONREACTIVE

## 2019-02-24 LAB — OB RESULTS CONSOLE RPR: RPR: NONREACTIVE

## 2019-02-27 NOTE — L&D Delivery Note (Signed)
Delivery Note She progressed to complete and pushed very well.  At 10:35 PM a viable female was delivered via Vaginal, Spontaneous (Presentation:   Occiput Anterior).  APGAR: 8, 9; weight pending.   Placenta status: Spontaneous, Intact.  Cord: 3 vessels with the following complications: nuchal x 1 reduced.   Anesthesia: Epidural Episiotomy: None Lacerations: 1st degree Suture Repair: 3.0 vicryl rapide Est. Blood Loss (mL): 73  Mom to postpartum.  Baby to Couplet care / Skin to Skin.  They desire circumcision in the hospital and have paid.  Will continue on Procardia XL 30 mg postpartum and monitor BP  Carrie Haley 05/05/2019, 10:52 PM

## 2019-03-11 ENCOUNTER — Encounter: Payer: 59 | Attending: Obstetrics and Gynecology

## 2019-03-11 ENCOUNTER — Other Ambulatory Visit: Payer: Self-pay

## 2019-03-11 DIAGNOSIS — O99810 Abnormal glucose complicating pregnancy: Secondary | ICD-10-CM | POA: Diagnosis not present

## 2019-03-11 NOTE — Progress Notes (Signed)

## 2019-05-04 ENCOUNTER — Other Ambulatory Visit (HOSPITAL_COMMUNITY)
Admission: RE | Admit: 2019-05-04 | Discharge: 2019-05-04 | Disposition: A | Discharge status: Home / Self Care | Payer: 59 | Source: Ambulatory Visit | Attending: Obstetrics and Gynecology | Admitting: Obstetrics and Gynecology

## 2019-05-04 ENCOUNTER — Other Ambulatory Visit: Payer: Self-pay

## 2019-05-04 ENCOUNTER — Other Ambulatory Visit: Payer: Self-pay | Admitting: Obstetrics and Gynecology

## 2019-05-04 LAB — SARS CORONAVIRUS 2 (TAT 6-24 HRS): SARS Coronavirus 2: NEGATIVE

## 2019-05-04 NOTE — MAU Note (Signed)
covid swab collected Pt tolerated well. Asymptomatic 

## 2019-05-05 ENCOUNTER — Inpatient Hospital Stay (HOSPITAL_COMMUNITY): Payer: 59

## 2019-05-05 ENCOUNTER — Other Ambulatory Visit: Payer: Self-pay

## 2019-05-05 ENCOUNTER — Inpatient Hospital Stay (HOSPITAL_COMMUNITY): Payer: 59 | Admitting: Anesthesiology

## 2019-05-05 ENCOUNTER — Encounter (HOSPITAL_COMMUNITY): Payer: Self-pay | Admitting: Obstetrics and Gynecology

## 2019-05-05 ENCOUNTER — Inpatient Hospital Stay (HOSPITAL_COMMUNITY)
Admission: AD | Admit: 2019-05-05 | Discharge: 2019-05-07 | DRG: 807 | Disposition: A | Discharge status: Home / Self Care | Payer: 59 | Attending: Obstetrics and Gynecology | Admitting: Obstetrics and Gynecology

## 2019-05-05 DIAGNOSIS — O99214 Obesity complicating childbirth: Secondary | ICD-10-CM | POA: Diagnosis present

## 2019-05-05 DIAGNOSIS — O24425 Gestational diabetes mellitus in childbirth, controlled by oral hypoglycemic drugs: Secondary | ICD-10-CM | POA: Diagnosis present

## 2019-05-05 DIAGNOSIS — Z20822 Contact with and (suspected) exposure to covid-19: Secondary | ICD-10-CM | POA: Diagnosis present

## 2019-05-05 DIAGNOSIS — O134 Gestational [pregnancy-induced] hypertension without significant proteinuria, complicating childbirth: Secondary | ICD-10-CM | POA: Diagnosis present

## 2019-05-05 DIAGNOSIS — O24415 Gestational diabetes mellitus in pregnancy, controlled by oral hypoglycemic drugs: Secondary | ICD-10-CM | POA: Diagnosis present

## 2019-05-05 DIAGNOSIS — Z3A37 37 weeks gestation of pregnancy: Secondary | ICD-10-CM

## 2019-05-05 DIAGNOSIS — O99334 Smoking (tobacco) complicating childbirth: Secondary | ICD-10-CM | POA: Diagnosis present

## 2019-05-05 DIAGNOSIS — F17210 Nicotine dependence, cigarettes, uncomplicated: Secondary | ICD-10-CM | POA: Diagnosis present

## 2019-05-05 DIAGNOSIS — O133 Gestational [pregnancy-induced] hypertension without significant proteinuria, third trimester: Secondary | ICD-10-CM | POA: Diagnosis present

## 2019-05-05 LAB — COMPREHENSIVE METABOLIC PANEL
ALT: 34 U/L (ref 0–44)
AST: 28 U/L (ref 15–41)
Albumin: 2.9 g/dL — ABNORMAL LOW (ref 3.5–5.0)
Alkaline Phosphatase: 145 U/L — ABNORMAL HIGH (ref 38–126)
Anion gap: 13 (ref 5–15)
BUN: 8 mg/dL (ref 6–20)
CO2: 18 mmol/L — ABNORMAL LOW (ref 22–32)
Calcium: 8.7 mg/dL — ABNORMAL LOW (ref 8.9–10.3)
Chloride: 101 mmol/L (ref 98–111)
Creatinine, Ser: 0.58 mg/dL (ref 0.44–1.00)
GFR calc Af Amer: >60 mL/min (ref >60)
GFR calc non Af Amer: >60 mL/min (ref >60)
Glucose, Bld: 133 mg/dL — ABNORMAL HIGH (ref 70–99)
Potassium: 3.5 mmol/L (ref 3.5–5.1)
Sodium: 132 mmol/L — ABNORMAL LOW (ref 135–145)
Total Bilirubin: 0.5 mg/dL (ref 0.3–1.2)
Total Protein: 7 g/dL (ref 6.5–8.1)

## 2019-05-05 LAB — TYPE AND SCREEN
ABO/RH(D): O POS
Antibody Screen: NEGATIVE

## 2019-05-05 LAB — OB RESULTS CONSOLE GBS: GBS: NEGATIVE

## 2019-05-05 LAB — CBC
HCT: 40.6 % (ref 36.0–46.0)
HCT: 40.8 % (ref 36.0–46.0)
Hemoglobin: 12.9 g/dL (ref 12.0–15.0)
Hemoglobin: 13 g/dL (ref 12.0–15.0)
MCH: 27.6 pg (ref 26.0–34.0)
MCH: 27.5 pg (ref 26.0–34.0)
MCHC: 31.8 g/dL (ref 30.0–36.0)
MCHC: 31.9 g/dL (ref 30.0–36.0)
MCV: 86.8 fL (ref 80.0–100.0)
MCV: 86.3 fL (ref 80.0–100.0)
Platelets: 439 10*3/uL — ABNORMAL HIGH (ref 150–400)
Platelets: 419 10*3/uL — ABNORMAL HIGH (ref 150–400)
RBC: 4.68 MIL/uL (ref 3.87–5.11)
RBC: 4.73 MIL/uL (ref 3.87–5.11)
RDW: 13.7 % (ref 11.5–15.5)
RDW: 13.5 % (ref 11.5–15.5)
WBC: 6.6 10*3/uL (ref 4.0–10.5)
WBC: 6.1 10*3/uL (ref 4.0–10.5)
nRBC: 0 % (ref 0.0–0.2)
nRBC: 0 % (ref 0.0–0.2)

## 2019-05-05 LAB — PROTEIN / CREATININE RATIO, URINE
Creatinine, Urine: 37.39 mg/dL
Total Protein, Urine: <3 mg/dL

## 2019-05-05 LAB — GLUCOSE, CAPILLARY
Glucose-Capillary: 114 mg/dL — ABNORMAL HIGH (ref 70–99)
Glucose-Capillary: 78 mg/dL (ref 70–99)
Glucose-Capillary: 65 mg/dL — ABNORMAL LOW (ref 70–99)
Glucose-Capillary: 93 mg/dL (ref 70–99)

## 2019-05-05 LAB — ABO/RH: ABO/RH(D): O POS

## 2019-05-05 MED ORDER — PHENYLEPHRINE 40 MCG/ML (10ML) SYRINGE FOR IV PUSH (FOR BLOOD PRESSURE SUPPORT): 80.0000 ug | PREFILLED_SYRINGE | INTRAVENOUS | Status: DC | PRN

## 2019-05-05 MED ORDER — LACTATED RINGERS IV SOLN: INTRAVENOUS | Status: DC

## 2019-05-05 MED ORDER — OXYCODONE-ACETAMINOPHEN 5-325 MG PO TABS: 2.0000 | ORAL_TABLET | ORAL | Status: DC | PRN

## 2019-05-05 MED ORDER — PHENYLEPHRINE 40 MCG/ML (10ML) SYRINGE FOR IV PUSH (FOR BLOOD PRESSURE SUPPORT)
80.0000 ug | PREFILLED_SYRINGE | INTRAVENOUS | Status: DC | PRN
  Filled 2019-05-05: qty 10

## 2019-05-05 MED ORDER — OXYCODONE-ACETAMINOPHEN 5-325 MG PO TABS: 1.0000 | ORAL_TABLET | ORAL | Status: DC | PRN

## 2019-05-05 MED ORDER — FENTANYL-BUPIVACAINE-NACL 0.5-0.125-0.9 MG/250ML-% EP SOLN
12.0000 mL/h | EPIDURAL | Status: DC | PRN
  Filled 2019-05-05: qty 250

## 2019-05-05 MED ORDER — EPHEDRINE 5 MG/ML INJ: 10.0000 mg | INTRAVENOUS | Status: DC | PRN

## 2019-05-05 MED ORDER — LIDOCAINE HCL (PF) 1 % IJ SOLN: 30.0000 mL | INTRAMUSCULAR | Status: DC | PRN

## 2019-05-05 MED ORDER — SODIUM CHLORIDE (PF) 0.9 % IJ SOLN
INTRAMUSCULAR | Status: DC | PRN
  Administered 2019-05-05: 12 mL/h via EPIDURAL

## 2019-05-05 MED ORDER — OXYTOCIN 40 UNITS IN NORMAL SALINE INFUSION - SIMPLE MED
2.5000 [IU]/h | INTRAVENOUS | Status: DC
  Administered 2019-05-05: 2.5 [IU]/h via INTRAVENOUS

## 2019-05-05 MED ORDER — LACTATED RINGERS IV SOLN: 500.0000 mL | INTRAVENOUS | Status: DC | PRN

## 2019-05-05 MED ORDER — BUTORPHANOL TARTRATE 1 MG/ML IJ SOLN
1.0000 mg | INTRAMUSCULAR | Status: DC | PRN
  Administered 2019-05-05 (×3): 1 mg via INTRAVENOUS
  Filled 2019-05-05 (×3): qty 1

## 2019-05-05 MED ORDER — OXYTOCIN BOLUS FROM INFUSION
500.0000 mL | Freq: Once | INTRAVENOUS | Status: AC
  Administered 2019-05-05: 500 mL via INTRAVENOUS

## 2019-05-05 MED ORDER — LABETALOL HCL 5 MG/ML IV SOLN: 80.0000 mg | INTRAVENOUS | Status: DC | PRN

## 2019-05-05 MED ORDER — DIPHENHYDRAMINE HCL 50 MG/ML IJ SOLN: 12.5000 mg | INTRAMUSCULAR | Status: DC | PRN

## 2019-05-05 MED ORDER — LABETALOL HCL 5 MG/ML IV SOLN
20.0000 mg | INTRAVENOUS | Status: DC | PRN
  Administered 2019-05-05: 20 mg via INTRAVENOUS
  Filled 2019-05-05: qty 4

## 2019-05-05 MED ORDER — OXYTOCIN 40 UNITS IN NORMAL SALINE INFUSION - SIMPLE MED
1.0000 m[IU]/min | INTRAVENOUS | Status: DC
  Administered 2019-05-05: 2 m[IU]/min via INTRAVENOUS
  Filled 2019-05-05: qty 1000

## 2019-05-05 MED ORDER — LACTATED RINGERS IV SOLN: 500.0000 mL | Freq: Once | INTRAVENOUS | Status: DC

## 2019-05-05 MED ORDER — TERBUTALINE SULFATE 1 MG/ML IJ SOLN: 0.2500 mg | Freq: Once | INTRAMUSCULAR | Status: DC | PRN

## 2019-05-05 MED ORDER — SOD CITRATE-CITRIC ACID 500-334 MG/5ML PO SOLN: 30.0000 mL | ORAL | Status: DC | PRN

## 2019-05-05 MED ORDER — HYDRALAZINE HCL 20 MG/ML IJ SOLN: 10.0000 mg | INTRAMUSCULAR | Status: DC | PRN

## 2019-05-05 MED ORDER — ONDANSETRON HCL 4 MG/2ML IJ SOLN
4.0000 mg | Freq: Four times a day (QID) | INTRAMUSCULAR | Status: DC | PRN
  Administered 2019-05-05: 4 mg via INTRAVENOUS
  Filled 2019-05-05 (×2): qty 2

## 2019-05-05 MED ORDER — PROMETHAZINE HCL 25 MG/ML IJ SOLN
12.5000 mg | INTRAMUSCULAR | Status: DC | PRN
  Administered 2019-05-05: 12.5 mg via INTRAVENOUS
  Filled 2019-05-05: qty 1

## 2019-05-05 MED ORDER — ACETAMINOPHEN 325 MG PO TABS: 650.0000 mg | ORAL_TABLET | ORAL | Status: DC | PRN

## 2019-05-05 MED ORDER — LIDOCAINE-EPINEPHRINE (PF) 2 %-1:200000 IJ SOLN
INTRAMUSCULAR | Status: DC | PRN
  Administered 2019-05-05 (×2): 2 mL via EPIDURAL

## 2019-05-05 MED ORDER — LABETALOL HCL 5 MG/ML IV SOLN
40.0000 mg | INTRAVENOUS | Status: DC | PRN
  Administered 2019-05-05: 40 mg via INTRAVENOUS
  Filled 2019-05-05: qty 8

## 2019-05-05 NOTE — Progress Notes (Signed)
Comfortable with epidural Afeb, VSS, BP 120-160/40-90 FHT- 120-130, mod variability, some late, variable and early decels, + accels, + scalp stim, Cat II, ctx q 2-3 min when tracing VE-7-8/C/+1 Making progress in active labor, BP labile but overall ok.  Continue pitocin, monitor progress and FHT, anticipate SVD

## 2019-05-05 NOTE — Anesthesia Procedure Notes (Signed)
Epidural Patient location during procedure: OB Start time: 05/05/2019 4:45 PM End time: 05/05/2019 5:00 PM  Staffing Anesthesiologist: Elmer Picker, MD Performed: anesthesiologist   Preanesthetic Checklist Completed: patient identified, IV checked, risks and benefits discussed, monitors and equipment checked, pre-op evaluation and timeout performed  Epidural Patient position: sitting Prep: DuraPrep and site prepped and draped Patient monitoring: continuous pulse ox, blood pressure, heart rate and cardiac monitor Approach: midline Location: L3-L4 Injection technique: LOR air  Needle:  Needle type: Tuohy  Needle gauge: 17 G Needle length: 9 cm Needle insertion depth: 9 cm Catheter type: closed end flexible Catheter size: 19 Gauge Catheter at skin depth: 15 cm Test dose: negative  Assessment Sensory level: T8 Events: blood not aspirated, injection not painful, no injection resistance, no paresthesia and negative IV test  Additional Notes Patient identified. Risks/Benefits/Options discussed with patient including but not limited to bleeding, infection, nerve damage, paralysis, failed block, incomplete pain control, headache, blood pressure changes, nausea, vomiting, reactions to medication both or allergic, itching and postpartum back pain. Confirmed with bedside nurse the patient's most recent platelet count. Confirmed with patient that they are not currently taking any anticoagulation, have any bleeding history or any family history of bleeding disorders. Patient expressed understanding and wished to proceed. All questions were answered. Sterile technique was used throughout the entire procedure. Please see nursing notes for vital signs. Test dose was given through epidural catheter and negative prior to continuing to dose epidural or start infusion. Warning signs of high block given to the patient including shortness of breath, tingling/numbness in hands, complete motor block, or  any concerning symptoms with instructions to call for help. Patient was given instructions on fall risk and not to get out of bed. All questions and concerns addressed with instructions to call with any issues or inadequate analgesia.  Reason for block:procedure for pain

## 2019-05-05 NOTE — Anesthesia Preprocedure Evaluation (Signed)
Anesthesia Evaluation  Patient identified by MRN, date of birth, ID band Patient awake    Reviewed: Allergy & Precautions, NPO status , Patient's Chart, lab work & pertinent test results  Airway Mallampati: III  TM Distance: >3 FB Neck ROM: Full    Dental no notable dental hx.    Pulmonary neg pulmonary ROS, Current SmokerPatient did not abstain from smoking.,    Pulmonary exam normal breath sounds clear to auscultation       Cardiovascular hypertension (preE), Normal cardiovascular exam Rhythm:Regular Rate:Normal     Neuro/Psych PSYCHIATRIC DISORDERS Depression negative neurological ROS     GI/Hepatic negative GI ROS, Neg liver ROS,   Endo/Other  diabetes, GestationalMorbid obesity  Renal/GU negative Renal ROS  negative genitourinary   Musculoskeletal negative musculoskeletal ROS (+)   Abdominal   Peds  Hematology negative hematology ROS (+)   Anesthesia Other Findings   Reproductive/Obstetrics (+) Pregnancy                             Anesthesia Physical Anesthesia Plan  ASA: III  Anesthesia Plan: Epidural   Post-op Pain Management:    Induction:   PONV Risk Score and Plan: Treatment may vary due to age or medical condition  Airway Management Planned: Natural Airway  Additional Equipment:   Intra-op Plan:   Post-operative Plan:   Informed Consent: I have reviewed the patients History and Physical, chart, labs and discussed the procedure including the risks, benefits and alternatives for the proposed anesthesia with the patient or authorized representative who has indicated his/her understanding and acceptance.       Plan Discussed with: Anesthesiologist  Anesthesia Plan Comments: (Patient identified. Risks, benefits, options discussed with patient including but not limited to bleeding, infection, nerve damage, paralysis, failed block, incomplete pain control, headache,  blood pressure changes, nausea, vomiting, reactions to medication, itching, and post partum back pain. Confirmed with bedside nurse the patient's most recent platelet count. Confirmed with the patient that they are not taking any anticoagulation, have any bleeding history or any family history of bleeding disorders. Patient expressed understanding and wishes to proceed. All questions were answered. )        Anesthesia Quick Evaluation

## 2019-05-05 NOTE — H&P (Signed)
Carrie Haley is a 34 y.o. female, G1Po, EGA [redacted] weeks with EDC 3-30 presenting for induction for PIH.  Has had BP in the office up to 160/100, normal PIH labs, no proteinuria, no PIH sx.  Also with GDM controlled with Metformin, reactive NSTs.  OB History    Gravida  1   Para  0   Term  0   Preterm  0   AB  0   Living  0     SAB  0   TAB  0   Ectopic  0   Multiple  0   Live Births             Past Medical History:  Diagnosis Date  . Depression    Past Surgical History:  Procedure Laterality Date  . ABDOMINOPLASTY  2015   Family History: family history includes Asthma in her father; Cancer in her father; Diabetes in her father and mother; Hepatitis in her father; Hypertension in her mother. Social History:  reports that she has been smoking cigarettes. She has a 0.75 pack-year smoking history. She has never used smokeless tobacco. She reports current alcohol use. She reports that she does not use drugs.     Maternal Diabetes: Yes:  Diabetes Type:  Insulin/Medication controlled Genetic Screening: Declined Maternal Ultrasounds/Referrals: Normal Fetal Ultrasounds or other Referrals:  None Maternal Substance Abuse:  No Significant Maternal Medications:  None Significant Maternal Lab Results:  Group B Strep negative Other Comments:  None  Review of Systems  Respiratory: Negative.   Cardiovascular: Negative.    Maternal Medical History:  Contractions: Perceived severity is moderate.    Fetal activity: Perceived fetal activity is normal.    Prenatal complications: PIH.   Prenatal Complications - Diabetes: gestational. Diabetes is managed by oral agent (monotherapy).      Dilation: 2 Effacement (%): 70 Station: -2 Exam by:: Dr. Jackelyn Knife Blood pressure (!) 153/86, pulse 81, temperature 98.1 F (36.7 C), temperature source Axillary, resp. rate 18, height 5\' 6"  (1.676 m), weight 117.5 kg. Maternal Exam:  Uterine Assessment: Contraction strength is  moderate.  Contraction frequency is regular.   Abdomen: Patient reports no abdominal tenderness. Estimated fetal weight is 7 lbs.   Fetal presentation: vertex  Introitus: Normal vulva. Normal vagina.  Amniotic fluid character: not assessed.  Pelvis: adequate for delivery.      Fetal Exam Fetal Monitor Review: Mode: ultrasound.   Baseline rate: 130.  Variability: moderate (6-25 bpm).   Pattern: accelerations present and no decelerations.    Fetal State Assessment: Category I - tracings are normal.     Physical Exam  Vitals reviewed. Constitutional: She appears well-developed and well-nourished.  Cardiovascular: Normal rate and regular rhythm.  Respiratory: Effort normal. No respiratory distress.  GI: Soft.  Genitourinary:    Vulva normal.     Prenatal labs: ABO, Rh: --/--/O POS, O POS Performed at [facility], [address], [place], [place]  [phone]) Antibody: NEG (03/09 0920) Rubella: Immune (08/29 0000) RPR: Nonreactive (12/29 0000)  HBsAg: Negative (08/29 0000)  HIV: Non-reactive (12/29 0000)  GBS:   Neg  Assessment/Plan: IUP at 37 weeks, A2GDM controlled with Metformin, PIH with normal labs, for induction. On pitocin, BP ok so far, attempted AROM.  Will monitor progress, monitor CBG and BP and treat prn    10-21-2004 Evette Diclemente 05/05/2019, 1:20 PM

## 2019-05-05 NOTE — Progress Notes (Signed)
During a brief interview, the patient reports that neither she or her family members have a history of problems with anesthesia, including MH. Her airway is a MP 2. She has a nose piercing. She has had GA for abdominoplasty without difficulty. Patient reports PIH and GDM on metformin. Patient reports no other pertinent medical history or other problems with this pregnancy. Any patient question and/or concerns were addressed.  She was informed that the anesthesia team is available to her 24/7 if she needs [place] or has further questions.

## 2019-05-06 ENCOUNTER — Encounter (HOSPITAL_COMMUNITY): Payer: Self-pay | Admitting: Obstetrics and Gynecology

## 2019-05-06 LAB — CBC
HCT: 37.6 % (ref 36.0–46.0)
Hemoglobin: 11.8 g/dL — ABNORMAL LOW (ref 12.0–15.0)
MCH: 27.4 pg (ref 26.0–34.0)
MCHC: 31.4 g/dL (ref 30.0–36.0)
MCV: 87.2 fL (ref 80.0–100.0)
Platelets: 389 10*3/uL (ref 150–400)
RBC: 4.31 MIL/uL (ref 3.87–5.11)
RDW: 13.5 % (ref 11.5–15.5)
WBC: 6.3 10*3/uL (ref 4.0–10.5)
nRBC: 0 % (ref 0.0–0.2)

## 2019-05-06 LAB — RPR: RPR Ser Ql: NONREACTIVE

## 2019-05-06 MED ORDER — METHYLERGONOVINE MALEATE 0.2 MG PO TABS: 0.2000 mg | ORAL_TABLET | ORAL | Status: DC | PRN

## 2019-05-06 MED ORDER — TETANUS-DIPHTH-ACELL PERTUSSIS 5-2.5-18.5 LF-MCG/0.5 IM SUSP: 0.5000 mL | Freq: Once | INTRAMUSCULAR | Status: DC

## 2019-05-06 MED ORDER — OXYCODONE HCL 5 MG PO TABS: 5.0000 mg | ORAL_TABLET | ORAL | Status: DC | PRN

## 2019-05-06 MED ORDER — SENNOSIDES-DOCUSATE SODIUM 8.6-50 MG PO TABS
2.0000 | ORAL_TABLET | ORAL | Status: DC
  Administered 2019-05-06 – 2019-05-07 (×2): 2 via ORAL
  Filled 2019-05-06 (×2): qty 2

## 2019-05-06 MED ORDER — SIMETHICONE 80 MG PO CHEW: 80.0000 mg | CHEWABLE_TABLET | ORAL | Status: DC | PRN

## 2019-05-06 MED ORDER — BENZOCAINE-MENTHOL 20-0.5 % EX AERO: 1.0000 "application " | INHALATION_SPRAY | CUTANEOUS | Status: DC | PRN

## 2019-05-06 MED ORDER — NIFEDIPINE ER OSMOTIC RELEASE 30 MG PO TB24
30.0000 mg | ORAL_TABLET | Freq: Every day | ORAL | Status: DC
  Administered 2019-05-06 (×2): 30 mg via ORAL
  Filled 2019-05-06 (×2): qty 1

## 2019-05-06 MED ORDER — ZOLPIDEM TARTRATE 5 MG PO TABS: 5.0000 mg | ORAL_TABLET | Freq: Every evening | ORAL | Status: DC | PRN

## 2019-05-06 MED ORDER — METHYLERGONOVINE MALEATE 0.2 MG/ML IJ SOLN: 0.2000 mg | INTRAMUSCULAR | Status: DC | PRN

## 2019-05-06 MED ORDER — COCONUT OIL OIL: 1.0000 "application " | TOPICAL_OIL | Status: DC | PRN

## 2019-05-06 MED ORDER — ONDANSETRON HCL 4 MG/2ML IJ SOLN: 4.0000 mg | INTRAMUSCULAR | Status: DC | PRN

## 2019-05-06 MED ORDER — DIPHENHYDRAMINE HCL 25 MG PO CAPS: 25.0000 mg | ORAL_CAPSULE | Freq: Four times a day (QID) | ORAL | Status: DC | PRN

## 2019-05-06 MED ORDER — PRENATAL MULTIVITAMIN CH
1.0000 | ORAL_TABLET | Freq: Every day | ORAL | Status: DC
  Administered 2019-05-06 – 2019-05-07 (×2): 1 via ORAL
  Filled 2019-05-06 (×2): qty 1

## 2019-05-06 MED ORDER — MAGNESIUM HYDROXIDE 400 MG/5ML PO SUSP: 30.0000 mL | ORAL | Status: DC | PRN

## 2019-05-06 MED ORDER — MEASLES, MUMPS & RUBELLA VAC IJ SOLR: 0.5000 mL | Freq: Once | INTRAMUSCULAR | Status: DC

## 2019-05-06 MED ORDER — ACETAMINOPHEN 325 MG PO TABS
650.0000 mg | ORAL_TABLET | ORAL | Status: DC | PRN
  Administered 2019-05-06: 650 mg via ORAL
  Filled 2019-05-06: qty 2

## 2019-05-06 MED ORDER — WITCH HAZEL-GLYCERIN EX PADS: 1.0000 "application " | MEDICATED_PAD | CUTANEOUS | Status: DC | PRN

## 2019-05-06 MED ORDER — OXYCODONE HCL 5 MG PO TABS: 10.0000 mg | ORAL_TABLET | ORAL | Status: DC | PRN

## 2019-05-06 MED ORDER — DIBUCAINE (PERIANAL) 1 % EX OINT: 1.0000 "application " | TOPICAL_OINTMENT | CUTANEOUS | Status: DC | PRN

## 2019-05-06 MED ORDER — IBUPROFEN 600 MG PO TABS
600.0000 mg | ORAL_TABLET | Freq: Four times a day (QID) | ORAL | Status: DC
  Administered 2019-05-06 – 2019-05-07 (×7): 600 mg via ORAL
  Filled 2019-05-06 (×7): qty 1

## 2019-05-06 MED ORDER — ONDANSETRON HCL 4 MG PO TABS: 4.0000 mg | ORAL_TABLET | ORAL | Status: DC | PRN

## 2019-05-06 NOTE — Lactation Note (Signed)
This note was copied from a baby's chart. Lactation Consultation Note  Patient Name: Carrie Haley Date: 05/06/2019 Reason for consult: Initial assessment;Infant < 6lbs;Early term 37-38.6wks;Primapara P1. 37 weeks.  Mom chooses to exclusively pump and bottle feed.  She is currently pumping drops.  Reassured and discussed milk coming to volume.  Stressed importance of pumping every 3 hours for 15-20 minutes.  Reviewed initiation setting on pump.  Mom has 2 breast pumps at home.  Instructed to finger feed colostrum drops.  Breastfeeding consultation services information given and reviewed.  Encouraged to call for assist prn.  Maternal Data    Feeding Feeding Type: Bottle Fed - Formula  LATCH Score                   Interventions Interventions: DEBP  Lactation Tools Discussed/Used Initiated by:: RN Date initiated:: 05/05/19   Consult Status Consult Status: Follow-up Date: 05/07/19 Follow-up type: In-patient    Huston Foley 05/06/2019, 2:21 PM

## 2019-05-06 NOTE — Anesthesia Postprocedure Evaluation (Signed)
Anesthesia Post Note  Patient: DURENDA PECHACEK  Procedure(s) Performed: AN AD HOC LABOR EPIDURAL     Patient location during evaluation: Mother Baby Anesthesia Type: Epidural Level of consciousness: awake and alert Pain management: pain level controlled Vital Signs Assessment: post-procedure vital signs reviewed and stable Respiratory status: spontaneous breathing, nonlabored ventilation and respiratory function stable Cardiovascular status: stable Postop Assessment: no headache, no backache and epidural receding Anesthetic complications: no    Last Vitals:  Vitals:   05/06/19 0129 05/06/19 0400  BP: 139/69 125/62  Pulse: 81 70  Resp: 17 16  Temp: 36.7 C 37.2 C  SpO2: 100% 100%    Last Pain:  Vitals:   05/06/19 0550  TempSrc:   PainSc: 0-No pain   Pain Goal:                   Junious Silk

## 2019-05-06 NOTE — Progress Notes (Signed)
POSTPARTUM PROGRESS NOTE  Post Partum Day #1  Subjective:  No acute events overnight.  Pt denies problems with ambulating, voiding or po intake.  She denies nausea or vomiting.  Pain is well controlled.  She has had flatus. She has not had bowel movement.  Lochia Minimal. Denies PreE symptoms. Is pumping and supplementing with bottle as necc. Desires hospital circ and has paid.   Objective: Blood pressure 125/62, pulse 70, temperature 99 F (37.2 C), temperature source Oral, resp. rate 16, height 5\' 6"  (1.676 m), weight 117.5 kg, SpO2 100 %, unknown if currently breastfeeding.  Physical Exam:  General: alert, cooperative and no distress Lochia:normal flow Chest: CTAB Heart: RRR no m/r/g Abdomen: +BS, soft, nontender Uterine Fundus: firm, umbilicus displaced 2/2 h/o abdominoplasty Extremities: neg edema, neg calf TTP BL, neg Homans BL  Recent Labs    05/05/19 1344 05/05/19 2327  HGB 13.0 11.8*  HCT 40.8 37.6    Assessment/Plan:  ASSESSMENT: Carrie Haley is a 34 y.o. G1P1001 s/p SVD @ [redacted]w[redacted]d. PNC c/b IOL for GHTN on Prc 30XL QD, GDMA2 on metformin (well-controlled).   Discharge home, Breastfeeding and Circumcision prior to discharge  GHTN: Continue Procardia 30XL QD> BP range 125-152/62-90 since delivery, asx. Continue to monitor, will need 1wk BP check postpartum GDMA2: will need 2hr pp glucola, random BG 93 yesterday.   Pending void for baby boy then plan on afternoon circ   LOS: 1 day

## 2019-05-06 NOTE — Progress Notes (Signed)
MOB was referred for history of depression/anxiety and PTSD.  * Referral screened out by Clinical Social Worker because none of the following criteria appear to apply:  ~ History of anxiety/depression during this pregnancy, or of post-partum depression following prior delivery. ~ Diagnosis of anxiety and/or depression within last 3 years. Per chart review, MOB's anxiety/depression date back to 2013. PNC records indicate history of PTSD that has been resolved. No concerns noted in PNC records.  OR * MOB's symptoms currently being treated with medication and/or therapy.  Please contact the Clinical Social Worker if needs arise, by MOB request, or if MOB scores greater than 9/yes to question 10 on Edinburgh Postpartum Depression Screen.  Duayne Brideau, LCSW [facility] [phone]  

## 2019-05-07 MED ORDER — IBUPROFEN 600 MG PO TABS: 600.0000 mg | ORAL_TABLET | Freq: Four times a day (QID) | ORAL | 0 refills | Status: DC

## 2019-05-07 NOTE — Discharge Summary (Signed)
OB Discharge Summary     Patient Name: Carrie Haley DOB: 1986-02-18 MRN: [id number]  Date of admission: 05/05/2019 Delivering MD: Jackelyn Knife, TODD   Date of discharge: 05/07/2019  Admitting diagnosis: PIH (pregnancy induced hypertension), third trimester [O13.3] Intrauterine pregnancy: 105w0d     Secondary diagnosis:  Active Problems:   PIH (pregnancy induced hypertension), third trimester   Gestational diabetes mellitus (GDM) during pregnancy controlled on oral hypoglycemic therapy   SVD (spontaneous vaginal delivery)  Additional problems: none     Discharge diagnosis: Term Pregnancy Delivered                                                                                                Post partum procedures:none  Augmentation: AROM and Pitocin  Complications: None  Hospital course:  Induction of Labor With Vaginal Delivery   34 y.o. yo G1P1001 at [redacted]w[redacted]d was admitted to the hospital 05/05/2019 for induction of labor.  Indication for induction: Gestational hypertension and A2 DM.  Patient had an uncomplicated labor course as follows: Membrane Rupture Time/Date: 1:14 PM ,05/05/2019   Intrapartum Procedures: Episiotomy: None [1]                                         Lacerations:  1st degree [2]  Patient had delivery of a Viable infant.  Information for the patient's newborn:  Tyana, Butzer [[id number]]  Delivery Method: Vaginal, Spontaneous(Filed from Delivery Summary)    05/05/2019  Details of delivery can be found in separate delivery note.  Patient had a routine postpartum course and her blood pressures were controlled with procardia xl 30mg  postpartum. Patient is discharged home 05/07/19.  Physical exam  Vitals:   05/06/19 1445 05/06/19 2122 05/07/19 0529 05/07/19 0855  BP: 134/78 140/74 (!) 142/98 139/85  Pulse: 84 84 74 86  Resp: 16 18 17 18   Temp:  98.4 F (36.9 C) 98.3 F (36.8 C) 98.4 F (36.9 C)  TempSrc:  Oral Oral Oral  SpO2:   100% 100%  Weight:       Height:       General: alert and cooperative Lochia: appropriate Uterine Fundus: firm  Labs: Lab Results  Component Value Date   WBC 6.3 05/05/2019   HGB 11.8 (L) 05/05/2019   HCT 37.6 05/05/2019   MCV 87.2 05/05/2019   PLT 389 05/05/2019   CMP Latest Ref Rng & Units 05/05/2019  Glucose 70 - 99 mg/dL 07/05/2019)  BUN 6 - 20 mg/dL 8  Creatinine 07/05/2019 - 469(G mg/dL 2.95  Sodium 2.84 - 1.32 mmol/L 132(L)  Potassium 3.5 - 5.1 mmol/L 3.5  Chloride 98 - 111 mmol/L 101  CO2 22 - 32 mmol/L 18(L)  Calcium 8.9 - 10.3 mg/dL 440)  Total Protein 6.5 - 8.1 g/dL 7.0  Total Bilirubin 0.3 - 1.2 mg/dL 0.5  Alkaline Phos 38 - 126 U/L 145(H)  AST 15 - 41 U/L 28  ALT 0 - 44 U/L 34    Discharge instruction: per After Visit  Summary and "Baby and Me Booklet".  After visit meds:  Allergies as of 05/07/2019   No Known Allergies     Medication List    STOP taking these medications   aspirin EC 81 MG tablet   metFORMIN 500 MG tablet Commonly known as: GLUCOPHAGE     TAKE these medications   acetaminophen 325 MG tablet Commonly known as: TYLENOL Take 650 mg by mouth every 6 (six) hours as needed for mild pain or headache.   ibuprofen 600 MG tablet Commonly known as: ADVIL Take 1 tablet (600 mg total) by mouth every 6 (six) hours.   NIFEdipine 30 MG 24 hr tablet Commonly known as: PROCARDIA-XL/NIFEDICAL-XL Take 30 mg by mouth at bedtime.   prenatal multivitamin Tabs tablet Take 1 tablet by mouth daily at 12 noon.       Diet: routine diet  Activity: Advance as tolerated. Pelvic rest for 6 weeks.   Outpatient follow up:5 days Follow up Appt:No future appointments. Follow up Visit:No follow-ups on file.  Postpartum contraception: not indicated  Newborn Data: Live born female  Birth Weight: 5 lb 8.7 oz (2515 g) APGAR: 8, 9  Newborn Delivery   Birth date/time: 05/05/2019 22:35:00 Delivery type: Vaginal, Spontaneous      Baby Feeding: Bottle and Breast Disposition:home with  mother   05/07/2019 Logan Bores, MD

## 2019-05-07 NOTE — Lactation Note (Signed)
This note was copied from a baby's chart. Lactation Consultation Note  Patient Name: Carrie Haley [id number] Date: 05/07/2019 Reason for consult: Follow-up assessment Baby is 35 hours old/4% weight loss.  Mom states pumping is going well.  She is obtaining drops.  Discussed milk coming to volume and the prevention and treatment of engorgement.  Mom denies questions or concerns.  Encouraged to call for assist prn.  Maternal Data    Feeding Feeding Type: Bottle Fed - Formula  LATCH Score                   Interventions    Lactation Tools Discussed/Used     Consult Status Consult Status: Follow-up Date: 05/08/19 Follow-up type: In-patient    Huston Foley 05/07/2019, 9:41 AM

## 2019-05-07 NOTE — Progress Notes (Signed)
Post Partum Day 2 NSVD/gestational hypertension/GDM Subjective: no complaints and tolerating PO No HA or PIH sx, feels great   Objective: Blood pressure (!) 142/98, pulse 74, temperature 98.3 F (36.8 C), temperature source Oral, resp. rate 17, height 5\' 6"  (1.676 m), weight 117.5 kg, SpO2 100 %, unknown if currently breastfeeding.  Physical Exam:  General: alert and cooperative Lochia: appropriate Uterine Fundus: firm   Recent Labs    05/05/19 1344 05/05/19 2327  HGB 13.0 11.8*  HCT 40.8 37.6    Assessment/Plan:  Discharge home  BP WNL on Procardia XL 30mg  qd except for one high value this AM of 142/98, otherwise 120-130/60-80's.  Will continue procardia and check BP in office in 5 days Plan 2 hour GTT postpartum  LOS: 2 days   07/05/19 05/07/2019, 8:55 AM

## 2019-05-26 ENCOUNTER — Inpatient Hospital Stay (HOSPITAL_COMMUNITY): Admission: RE | Admit: 2019-05-26 | Payer: 59 | Source: Home / Self Care

## 2021-03-18 ENCOUNTER — Emergency Department (HOSPITAL_BASED_OUTPATIENT_CLINIC_OR_DEPARTMENT_OTHER): Payer: No Typology Code available for payment source | Admitting: Radiology

## 2021-03-18 ENCOUNTER — Other Ambulatory Visit: Payer: Self-pay

## 2021-03-18 ENCOUNTER — Emergency Department (HOSPITAL_BASED_OUTPATIENT_CLINIC_OR_DEPARTMENT_OTHER)
Admission: EM | Admit: 2021-03-18 | Discharge: 2021-03-18 | Disposition: A | Discharge status: Home / Self Care | Payer: No Typology Code available for payment source | Attending: Emergency Medicine | Admitting: Emergency Medicine

## 2021-03-18 ENCOUNTER — Emergency Department (HOSPITAL_BASED_OUTPATIENT_CLINIC_OR_DEPARTMENT_OTHER): Payer: No Typology Code available for payment source

## 2021-03-18 ENCOUNTER — Encounter (HOSPITAL_BASED_OUTPATIENT_CLINIC_OR_DEPARTMENT_OTHER): Payer: Self-pay

## 2021-03-18 DIAGNOSIS — R110 Nausea: Secondary | ICD-10-CM | POA: Insufficient documentation

## 2021-03-18 DIAGNOSIS — M25512 Pain in left shoulder: Secondary | ICD-10-CM | POA: Insufficient documentation

## 2021-03-18 DIAGNOSIS — M542 Cervicalgia: Secondary | ICD-10-CM | POA: Insufficient documentation

## 2021-03-18 DIAGNOSIS — R519 Headache, unspecified: Secondary | ICD-10-CM | POA: Insufficient documentation

## 2021-03-18 DIAGNOSIS — Y92410 Unspecified street and highway as the place of occurrence of the external cause: Secondary | ICD-10-CM | POA: Diagnosis not present

## 2021-03-18 MED ORDER — LIDOCAINE 5 % EX PTCH: 1.0000 | MEDICATED_PATCH | CUTANEOUS | 0 refills | Status: DC

## 2021-03-18 MED ORDER — LIDOCAINE 5 % EX PTCH
1.0000 | MEDICATED_PATCH | CUTANEOUS | Status: DC
  Administered 2021-03-18: 1 via TRANSDERMAL
  Filled 2021-03-18: qty 1

## 2021-03-18 MED ORDER — METHOCARBAMOL 500 MG PO TABS: 500.0000 mg | ORAL_TABLET | Freq: Two times a day (BID) | ORAL | 0 refills | Status: DC

## 2021-03-18 MED ORDER — IBUPROFEN 800 MG PO TABS: 800.0000 mg | ORAL_TABLET | Freq: Three times a day (TID) | ORAL | 0 refills | Status: DC
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
## 2021-03-18 NOTE — Discharge Instructions (Addendum)
Use the Lidoderm patches on the areas that hurt every 12 hours.  Take 800 of ibuprofen 3 times daily with food and water.  Do this for 7 days.  At night you can take the Robaxin to help with muscle relaxing.  Do not take it before you drive as it will make you drowsy.  Return if you start vomiting, cannot walk, or unable to urinate, have changes in vision.  Follow-up with PCP if back pain persist for greater than 6 weeks.
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
## 2021-11-20 LAB — OB RESULTS CONSOLE HIV ANTIBODY (ROUTINE TESTING): HIV: NONREACTIVE

## 2021-11-20 LAB — HEPATITIS C ANTIBODY: HCV Ab: NEGATIVE

## 2021-11-20 LAB — OB RESULTS CONSOLE HEPATITIS B SURFACE ANTIGEN: Hepatitis B Surface Ag: NEGATIVE

## 2021-11-20 LAB — OB RESULTS CONSOLE ABO/RH: RH Type: POSITIVE

## 2021-11-20 LAB — OB RESULTS CONSOLE GC/CHLAMYDIA
Chlamydia: NEGATIVE
Neisseria Gonorrhea: NEGATIVE

## 2021-11-20 LAB — OB RESULTS CONSOLE RPR: RPR: NONREACTIVE

## 2021-11-20 LAB — OB RESULTS CONSOLE RUBELLA ANTIBODY, IGM: Rubella: IMMUNE

## 2021-11-20 LAB — OB RESULTS CONSOLE ANTIBODY SCREEN: Antibody Screen: NEGATIVE

## 2022-01-03 ENCOUNTER — Ambulatory Visit: Payer: 59

## 2022-02-22 ENCOUNTER — Other Ambulatory Visit: Payer: Self-pay | Admitting: Obstetrics and Gynecology

## 2022-02-22 DIAGNOSIS — Z363 Encounter for antenatal screening for malformations: Secondary | ICD-10-CM

## 2022-02-22 DIAGNOSIS — O99212 Obesity complicating pregnancy, second trimester: Secondary | ICD-10-CM

## 2022-02-26 NOTE — L&D Delivery Note (Signed)
Delivery Note She progressed to complete and pushed with 1-2 ctx.  At 3:19 PM a viable female was delivered via Vaginal, Spontaneous (Presentation: Left Occiput Anterior).  APGAR: 9, 9; weight pending.   Placenta status: Spontaneous, Intact.  Cord: 3 vessels with the following complications: Nuchal x1 reduced.  There was not a true shoulder dystocia, but McRoberts maneuver assisted and shoulders did take about 45 seconds to deliver  Anesthesia: Epidural Episiotomy: None Lacerations: None Suture Repair:  none Est. Blood Loss (mL): 112   Mom to postpartum.  Baby to Couplet care / Skin to Skin.  Will d/c Procardia and metformin and treat BP prn for now  Carrie Haley 06/11/2022, 3:32 PM

## 2022-03-14 ENCOUNTER — Encounter: Payer: Self-pay | Admitting: Obstetrics and Gynecology

## 2022-03-16 ENCOUNTER — Encounter: Payer: Self-pay | Admitting: *Deleted

## 2022-03-21 ENCOUNTER — Ambulatory Visit: Payer: 59 | Admitting: *Deleted

## 2022-03-21 ENCOUNTER — Encounter: Payer: Self-pay | Admitting: *Deleted

## 2022-03-21 ENCOUNTER — Ambulatory Visit: Payer: 59 | Attending: Obstetrics and Gynecology

## 2022-03-21 ENCOUNTER — Other Ambulatory Visit: Payer: Self-pay | Admitting: *Deleted

## 2022-03-21 VITALS — BP 124/86 | HR 94

## 2022-03-21 DIAGNOSIS — Z363 Encounter for antenatal screening for malformations: Secondary | ICD-10-CM | POA: Insufficient documentation

## 2022-03-21 DIAGNOSIS — O09292 Supervision of pregnancy with other poor reproductive or obstetric history, second trimester: Secondary | ICD-10-CM

## 2022-03-21 DIAGNOSIS — E669 Obesity, unspecified: Secondary | ICD-10-CM

## 2022-03-21 DIAGNOSIS — R638 Other symptoms and signs concerning food and fluid intake: Secondary | ICD-10-CM

## 2022-03-21 DIAGNOSIS — Z3A26 26 weeks gestation of pregnancy: Secondary | ICD-10-CM

## 2022-03-21 DIAGNOSIS — Z3689 Encounter for other specified antenatal screening: Secondary | ICD-10-CM | POA: Insufficient documentation

## 2022-03-21 DIAGNOSIS — O24415 Gestational diabetes mellitus in pregnancy, controlled by oral hypoglycemic drugs: Secondary | ICD-10-CM | POA: Diagnosis not present

## 2022-03-21 DIAGNOSIS — O10912 Unspecified pre-existing hypertension complicating pregnancy, second trimester: Secondary | ICD-10-CM

## 2022-03-21 DIAGNOSIS — Z362 Encounter for other antenatal screening follow-up: Secondary | ICD-10-CM

## 2022-03-21 DIAGNOSIS — O99212 Obesity complicating pregnancy, second trimester: Secondary | ICD-10-CM | POA: Diagnosis not present

## 2022-03-21 DIAGNOSIS — O10012 Pre-existing essential hypertension complicating pregnancy, second trimester: Secondary | ICD-10-CM

## 2022-03-21 DIAGNOSIS — Z8759 Personal history of other complications of pregnancy, childbirth and the puerperium: Secondary | ICD-10-CM

## 2022-03-21 DIAGNOSIS — O24419 Gestational diabetes mellitus in pregnancy, unspecified control: Secondary | ICD-10-CM

## 2022-04-18 ENCOUNTER — Ambulatory Visit: Payer: 59 | Attending: Maternal & Fetal Medicine

## 2022-04-18 ENCOUNTER — Ambulatory Visit: Payer: 59

## 2022-05-02 ENCOUNTER — Ambulatory Visit: Payer: No Typology Code available for payment source

## 2022-05-17 ENCOUNTER — Inpatient Hospital Stay (HOSPITAL_COMMUNITY)
Admission: AD | Admit: 2022-05-17 | Discharge: 2022-05-17 | Disposition: A | Discharge status: Home / Self Care | Payer: 59 | Attending: Obstetrics and Gynecology | Admitting: Obstetrics and Gynecology

## 2022-05-17 ENCOUNTER — Encounter (HOSPITAL_COMMUNITY): Payer: Self-pay | Admitting: Obstetrics and Gynecology

## 2022-05-17 DIAGNOSIS — Z3689 Encounter for other specified antenatal screening: Secondary | ICD-10-CM

## 2022-05-17 DIAGNOSIS — O10919 Unspecified pre-existing hypertension complicating pregnancy, unspecified trimester: Secondary | ICD-10-CM

## 2022-05-17 DIAGNOSIS — Z3A34 34 weeks gestation of pregnancy: Secondary | ICD-10-CM

## 2022-05-17 DIAGNOSIS — Z7984 Long term (current) use of oral hypoglycemic drugs: Secondary | ICD-10-CM | POA: Insufficient documentation

## 2022-05-17 DIAGNOSIS — O24415 Gestational diabetes mellitus in pregnancy, controlled by oral hypoglycemic drugs: Secondary | ICD-10-CM | POA: Diagnosis not present

## 2022-05-17 DIAGNOSIS — O10913 Unspecified pre-existing hypertension complicating pregnancy, third trimester: Secondary | ICD-10-CM

## 2022-05-17 LAB — CBC
HCT: 41.6 % (ref 36.0–46.0)
Hemoglobin: 13.5 g/dL (ref 12.0–15.0)
MCH: 27.2 pg (ref 26.0–34.0)
MCHC: 32.5 g/dL (ref 30.0–36.0)
MCV: 83.7 fL (ref 80.0–100.0)
Platelets: 410 10*3/uL — ABNORMAL HIGH (ref 150–400)
RBC: 4.97 MIL/uL (ref 3.87–5.11)
RDW: 14.2 % (ref 11.5–15.5)
WBC: 6 10*3/uL (ref 4.0–10.5)
nRBC: 0 % (ref 0.0–0.2)

## 2022-05-17 LAB — COMPREHENSIVE METABOLIC PANEL
ALT: 14 U/L (ref 0–44)
AST: 15 U/L (ref 15–41)
Albumin: 2.6 g/dL — ABNORMAL LOW (ref 3.5–5.0)
Alkaline Phosphatase: 117 U/L (ref 38–126)
Anion gap: 10 (ref 5–15)
BUN: 5 mg/dL — ABNORMAL LOW (ref 6–20)
CO2: 22 mmol/L (ref 22–32)
Calcium: 8.6 mg/dL — ABNORMAL LOW (ref 8.9–10.3)
Chloride: 100 mmol/L (ref 98–111)
Creatinine, Ser: 0.66 mg/dL (ref 0.44–1.00)
GFR, Estimated: >60 mL/min (ref >60)
Glucose, Bld: 88 mg/dL (ref 70–99)
Potassium: 3.7 mmol/L (ref 3.5–5.1)
Sodium: 132 mmol/L — ABNORMAL LOW (ref 135–145)
Total Bilirubin: <0.1 mg/dL — ABNORMAL LOW (ref 0.3–1.2)
Total Protein: 6.3 g/dL — ABNORMAL LOW (ref 6.5–8.1)

## 2022-05-17 LAB — PROTEIN / CREATININE RATIO, URINE
Creatinine, Urine: 245 mg/dL
Protein Creatinine Ratio: 0.15 mg/mg{Cre} (ref 0.00–0.15)
Total Protein, Urine: 36 mg/dL

## 2022-05-17 MED ORDER — NIFEDIPINE 10 MG PO CAPS: 10.0000 mg | ORAL_CAPSULE | Freq: Three times a day (TID) | ORAL | Status: DC

## 2022-05-17 MED ORDER — NIFEDIPINE 10 MG PO CAPS
10.0000 mg | ORAL_CAPSULE | Freq: Once | ORAL | Status: AC
  Administered 2022-05-17: 10 mg via ORAL
  Filled 2022-05-17: qty 1

## 2022-05-17 MED ORDER — HYDRALAZINE HCL 20 MG/ML IJ SOLN
10.0000 mg | INTRAMUSCULAR | Status: DC | PRN
  Filled 2022-05-17: qty 1

## 2022-05-17 MED ORDER — LABETALOL HCL 5 MG/ML IV SOLN: 20.0000 mg | INTRAVENOUS | Status: DC | PRN

## 2022-05-17 MED ORDER — NIFEDIPINE ER OSMOTIC RELEASE 30 MG PO TB24: 30.0000 mg | ORAL_TABLET | Freq: Every day | ORAL | 2 refills | Status: DC

## 2022-05-17 MED ORDER — LABETALOL HCL 5 MG/ML IV SOLN: 40.0000 mg | INTRAVENOUS | Status: DC | PRN

## 2022-05-17 MED ORDER — HYDRALAZINE HCL 20 MG/ML IJ SOLN
5.0000 mg | INTRAMUSCULAR | Status: DC | PRN
  Administered 2022-05-17: 5 mg via INTRAVENOUS
  Filled 2022-05-17: qty 1

## 2022-05-17 NOTE — MAU Note (Signed)
Carrie Haley is a 37 y.o. at [redacted]w[redacted]d here in MAU reporting: had rtn appt, BP was high in the office, sent over for further eval.  Had same with first preg. Denies HA, visual changes, epigastric pain or increase in swelling. Denies bleeding or LOF.  Reports +FM  Onset of complaint: today Pain score: none Vitals:   05/17/22 1224  BP: (!) 155/98  Pulse: 78  Resp: 16  Temp: 98.6 F (37 C)  SpO2: 100%     FHT:162 Lab orders placed from triage:

## 2022-05-17 NOTE — MAU Provider Note (Signed)
Chief Complaint:  Hypertension  HPI   Event Date/Time   First Provider Initiated Contact with Patient 05/17/22 Benton is a 37 y.o. G2P1001 at [redacted]w[redacted]d who was sent to MAU from her OB office [facility]) for preeclampsia labs, serial blood pressures and extended fetal monitoring. Was seen today for BPP/NST which was 6/8, also noted to have two increased BP= 168/98, 154/100. Denies headache, visual disturbances, dizziness, epigastric pain or excess swelling. No other physical complaints.  Pregnancy Course: Per Dr. Marvel Plan, pt had elevated pressures earlier in the pregnancy (140s/80s, 150s/90s) but only 2-3x and then they were normotensive until today. Meets criteria for chronic hypertension, also has well controlled A2GDM (1000mg  metformin hs).  Past Medical History:  Diagnosis Date   Depression    Gestational diabetes    OB History  Gravida Para Term Preterm AB Living  2 1 1  0 0 1  SAB IAB Ectopic Multiple Live Births  0 0 0 0 1    # Outcome Date GA Lbr Len/2nd Weight Sex Delivery Anes PTL Lv  2 Current           1 Term 05/05/19 [redacted]w[redacted]d 06:28 / 00:07 5 lb 8.7 oz (2.515 kg) M Vag-Spont EPI  LIV   Past Surgical History:  Procedure Laterality Date   ABDOMINOPLASTY  2015   Family History  Problem Relation Age of Onset   Hypertension Mother    Diabetes Mother    Asthma Father    Cancer Father    Diabetes Father    Hepatitis Father        Hep B   Social History   Tobacco Use   Smoking status: Former    Packs/day: 0.25    Years: 3.00    Additional pack years: 0.00    Total pack years: 0.75    Types: Cigarettes   Smokeless tobacco: Never  Vaping Use   Vaping Use: Never used  Substance Use Topics   Alcohol use: Not Currently    Comment: OCC   Drug use: No   No Known Allergies Medications Prior to Admission  Medication Sig Dispense Refill Last Dose   aspirin EC 81 MG tablet Take 81 mg by mouth daily. Swallow whole.   05/17/2022   metformin  (FORTAMET) 500 MG (OSM) 24 hr tablet Take 500 mg by mouth daily with breakfast.   05/17/2022   Prenatal Vit-Fe Fumarate-FA (PRENATAL MULTIVITAMIN) TABS tablet Take 1 tablet by mouth daily at 12 noon.   05/17/2022   acetaminophen (TYLENOL) 325 MG tablet Take 650 mg by mouth every 6 (six) hours as needed for mild pain or headache. (Patient not taking: Reported on 03/21/2022)      ibuprofen (ADVIL) 800 MG tablet Take 1 tablet (800 mg total) by mouth 3 (three) times daily. (Patient not taking: Reported on 03/21/2022) 21 tablet 0    lidocaine (LIDODERM) 5 % Place 1 patch onto the skin daily. Remove & Discard patch within 12 hours or as directed by MD 30 patch 0    methocarbamol (ROBAXIN) 500 MG tablet Take 1 tablet (500 mg total) by mouth 2 (two) times daily. (Patient not taking: Reported on 03/21/2022) 20 tablet 0    [DISCONTINUED] NIFEdipine (PROCARDIA-XL/NIFEDICAL-XL) 30 MG 24 hr tablet Take 30 mg by mouth at bedtime.  (Patient not taking: Reported on 05/17/2022)   Not Taking   I have reviewed patient's Past Medical Hx, Surgical Hx, Family Hx, Social Hx, medications and allergies.  ROS  Pertinent items noted in HPI and remainder of comprehensive ROS otherwise negative.   PHYSICAL EXAM  Patient Vitals for the past 24 hrs:  BP Temp Temp src Pulse Resp SpO2 Height Weight  05/17/22 1531 (!) 142/81 -- -- 84 -- -- -- --  05/17/22 1516 (!) 144/82 -- -- 82 -- -- -- --  05/17/22 1501 (!) 149/86 -- -- 95 -- -- -- --  05/17/22 1457 (!) 146/86 -- -- 87 -- -- -- --  05/17/22 1446 (!) 167/90 -- -- 83 -- -- -- --  05/17/22 1432 (!) 164/92 -- -- 76 -- -- -- --  05/17/22 1416 (!) 149/89 -- -- 87 -- -- -- --  05/17/22 1406 (!) 151/94 -- -- 80 -- -- -- --  05/17/22 1401 (!) 186/100 -- -- 83 -- -- -- --  05/17/22 1352 (!) 159/89 -- -- 81 -- -- -- --  05/17/22 1331 (!) 174/102 -- -- 87 -- -- -- --  05/17/22 1307 (!) 168/107 -- -- 82 -- -- -- --  05/17/22 1242 (!) 168/98 -- -- 88 -- -- -- --  05/17/22 1224 (!)  155/98 98.6 F (37 C) Oral 78 16 100 % 5\' 7"  (1.702 m) 273 lb 8 oz (124.1 kg)   Constitutional: Well-developed, well-nourished female in no acute distress.  Cardiovascular: normal rate & rhythm, warm and well-perfused Respiratory: normal effort, no problems with respiration noted GI: Abd soft, non-tender, non-distended MS: Extremities nontender, no edema, normal ROM Neurologic: Alert and oriented x 4.  GU: no CVA tenderness Pelvic: exam deferred  Fetal Tracing: reactive Baseline: 140 Variability: moderate Accelerations: 15x15 Decelerations: none Toco: relaxed   Labs: Results for orders placed or performed during the hospital encounter of 05/17/22 (from the past 24 hour(s))  Protein / creatinine ratio, urine     Status: None   Collection Time: 05/17/22 12:38 PM  Result Value Ref Range   Creatinine, Urine 245 mg/dL   Total Protein, Urine 36 mg/dL   Protein Creatinine Ratio 0.15 0.00 - 0.15 mg/mg[Cre]  CBC     Status: Abnormal   Collection Time: 05/17/22  1:01 PM  Result Value Ref Range   WBC 6.0 4.0 - 10.5 K/uL   RBC 4.97 3.87 - 5.11 MIL/uL   Hemoglobin 13.5 12.0 - 15.0 g/dL   HCT 41.6 36.0 - 46.0 %   MCV 83.7 80.0 - 100.0 fL   MCH 27.2 26.0 - 34.0 pg   MCHC 32.5 30.0 - 36.0 g/dL   RDW 14.2 11.5 - 15.5 %   Platelets 410 (H) 150 - 400 K/uL   nRBC 0.0 0.0 - 0.2 %  Comprehensive metabolic panel     Status: Abnormal   Collection Time: 05/17/22  2:41 PM  Result Value Ref Range   Sodium 132 (L) 135 - 145 mmol/L   Potassium 3.7 3.5 - 5.1 mmol/L   Chloride 100 98 - 111 mmol/L   CO2 22 22 - 32 mmol/L   Glucose, Bld 88 70 - 99 mg/dL   BUN 5 (L) 6 - 20 mg/dL   Creatinine, Ser 0.66 0.44 - 1.00 mg/dL   Calcium 8.6 (L) 8.9 - 10.3 mg/dL   Total Protein 6.3 (L) 6.5 - 8.1 g/dL   Albumin 2.6 (L) 3.5 - 5.0 g/dL   AST 15 15 - 41 U/L   ALT 14 0 - 44 U/L   Alkaline Phosphatase 117 38 - 126 U/L   Total Bilirubin <0.1 (L) 0.3 - 1.2 mg/dL  GFR, Estimated >60 >60 mL/min   Anion gap 10  5 - 15   Imaging:  No results found.  MDM & MAU COURSE  MDM: Moderate  MAU Course: Orders Placed This Encounter  Procedures   CBC   Protein / creatinine ratio, urine   Comprehensive metabolic panel   Notify physician (specify) Confirmatory reading of BP> 160/110 15 minutes later   Apply Hypertensive Disorders of Pregnancy Care Plan   Measure blood pressure   Discharge patient   Meds ordered this encounter  Medications   AND Linked Order Group    hydrALAZINE (APRESOLINE) injection 5 mg    hydrALAZINE (APRESOLINE) injection 10 mg    labetalol (NORMODYNE) injection 20 mg    labetalol (NORMODYNE) injection 40 mg   DISCONTD: NIFEdipine (PROCARDIA) capsule 10 mg   NIFEdipine (PROCARDIA) capsule 10 mg   NIFEdipine (PROCARDIA-XL/NIFEDICAL-XL) 30 MG 24 hr tablet    Sig: Take 1 tablet (30 mg total) by mouth at bedtime.    Dispense:  30 tablet    Refill:  2    Order Specific Question:   Supervising Provider    Answer:   Verita Schneiders A [[id number]]   Initial BP severe range (168/98), repeat 155/98. Hypertensive protocol (hydralazine) ordered as well as preeclampsia labs. Discussed plan with patient who understands as she was induced in her last pregnancy at 37wks for gHTN.   One dose of hydralazine given and BP no longer severe range but still elevated. Since she meets criteria for cHTN, gave an oral dose of immediate release procardia which brought BP down even further (140s/80s). Pt remains asymptomatic, all labs normal and NST continuously reactive. Stable for discharge on daily procardia (to start tomorrow am) with strong preeclampsia precautions. Has follow up on Monday at [place], per Dr. Marvel Plan, pt will be induced at 37 weeks.  ASSESSMENT   1. Chronic hypertension during pregnancy   2. NST (non-stress test) reactive   3. [redacted] weeks gestation of pregnancy    PLAN  Discharge home in stable condition with preeclampsia precautions.     Follow-up Information      [facility] Follow up.   Why: as scheduled for ongoing prenatal care Contact information: [address]  [address] [place] [address] [phone]                 Allergies as of 05/17/2022   No Known Allergies      Medication List     STOP taking these medications    acetaminophen 325 MG tablet Commonly known as: TYLENOL   ibuprofen 800 MG tablet Commonly known as: ADVIL       TAKE these medications    aspirin EC 81 MG tablet Take 81 mg by mouth daily. Swallow whole.   lidocaine 5 % Commonly known as: Lidoderm Place 1 patch onto the skin daily. Remove & Discard patch within 12 hours or as directed by MD   metformin 500 MG (OSM) 24 hr tablet Commonly known as: FORTAMET Take 500 mg by mouth daily with breakfast.   methocarbamol 500 MG tablet Commonly known as: ROBAXIN Take 1 tablet (500 mg total) by mouth 2 (two) times daily.   NIFEdipine 30 MG 24 hr tablet Commonly known as: PROCARDIA-XL/NIFEDICAL-XL Take 1 tablet (30 mg total) by mouth at bedtime.   prenatal multivitamin Tabs tablet Take 1 tablet by mouth daily at 12 noon.       Gaylan Gerold, CNM, MSN, [facility] Certified Nurse Midwife, [facility]

## 2022-05-28 ENCOUNTER — Telehealth (HOSPITAL_COMMUNITY): Payer: Self-pay | Admitting: *Deleted

## 2022-05-30 ENCOUNTER — Encounter (HOSPITAL_COMMUNITY): Payer: Self-pay

## 2022-05-30 NOTE — Telephone Encounter (Signed)
Preadmission screen  

## 2022-05-31 ENCOUNTER — Telehealth (HOSPITAL_COMMUNITY): Payer: Self-pay | Admitting: *Deleted

## 2022-05-31 LAB — OB RESULTS CONSOLE GBS: GBS: NEGATIVE

## 2022-05-31 NOTE — Telephone Encounter (Signed)
Preadmission screen  

## 2022-06-04 ENCOUNTER — Telehealth (HOSPITAL_COMMUNITY): Payer: Self-pay | Admitting: *Deleted

## 2022-06-04 ENCOUNTER — Encounter (HOSPITAL_COMMUNITY): Payer: Self-pay | Admitting: *Deleted

## 2022-06-04 NOTE — Telephone Encounter (Signed)
Preadmission screen  

## 2022-06-10 ENCOUNTER — Other Ambulatory Visit: Payer: Self-pay | Admitting: Obstetrics and Gynecology

## 2022-06-10 DIAGNOSIS — O10913 Unspecified pre-existing hypertension complicating pregnancy, third trimester: Secondary | ICD-10-CM

## 2022-06-11 ENCOUNTER — Inpatient Hospital Stay (HOSPITAL_COMMUNITY): Payer: 59 | Admitting: Anesthesiology

## 2022-06-11 ENCOUNTER — Inpatient Hospital Stay (HOSPITAL_COMMUNITY): Payer: 59

## 2022-06-11 ENCOUNTER — Encounter (HOSPITAL_COMMUNITY): Payer: Self-pay | Admitting: Obstetrics and Gynecology

## 2022-06-11 ENCOUNTER — Other Ambulatory Visit: Payer: Self-pay

## 2022-06-11 ENCOUNTER — Inpatient Hospital Stay (HOSPITAL_COMMUNITY)
Admission: RE | Admit: 2022-06-11 | Discharge: 2022-06-12 | DRG: 806 | Disposition: A | Discharge status: Home / Self Care | Payer: 59 | Attending: Obstetrics and Gynecology | Admitting: Obstetrics and Gynecology

## 2022-06-11 DIAGNOSIS — O99214 Obesity complicating childbirth: Secondary | ICD-10-CM | POA: Diagnosis present

## 2022-06-11 DIAGNOSIS — O10913 Unspecified pre-existing hypertension complicating pregnancy, third trimester: Secondary | ICD-10-CM

## 2022-06-11 DIAGNOSIS — Z87891 Personal history of nicotine dependence: Secondary | ICD-10-CM

## 2022-06-11 DIAGNOSIS — Z3A38 38 weeks gestation of pregnancy: Secondary | ICD-10-CM

## 2022-06-11 DIAGNOSIS — O1092 Unspecified pre-existing hypertension complicating childbirth: Secondary | ICD-10-CM | POA: Diagnosis present

## 2022-06-11 DIAGNOSIS — O10919 Unspecified pre-existing hypertension complicating pregnancy, unspecified trimester: Principal | ICD-10-CM | POA: Diagnosis present

## 2022-06-11 DIAGNOSIS — O24425 Gestational diabetes mellitus in childbirth, controlled by oral hypoglycemic drugs: Secondary | ICD-10-CM | POA: Diagnosis present

## 2022-06-11 LAB — CBC
HCT: 42 % (ref 36.0–46.0)
HCT: 43.2 % (ref 36.0–46.0)
Hemoglobin: 13.7 g/dL (ref 12.0–15.0)
Hemoglobin: 14.2 g/dL (ref 12.0–15.0)
MCH: 26.9 pg (ref 26.0–34.0)
MCH: 27.6 pg (ref 26.0–34.0)
MCHC: 32.6 g/dL (ref 30.0–36.0)
MCHC: 32.9 g/dL (ref 30.0–36.0)
MCV: 82.5 fL (ref 80.0–100.0)
MCV: 83.9 fL (ref 80.0–100.0)
Platelets: 437 10*3/uL — ABNORMAL HIGH (ref 150–400)
Platelets: 396 10*3/uL (ref 150–400)
RBC: 5.09 MIL/uL (ref 3.87–5.11)
RBC: 5.15 MIL/uL — ABNORMAL HIGH (ref 3.87–5.11)
RDW: 15.2 % (ref 11.5–15.5)
RDW: 15.2 % (ref 11.5–15.5)
WBC: 5.9 10*3/uL (ref 4.0–10.5)
WBC: 11.2 10*3/uL — ABNORMAL HIGH (ref 4.0–10.5)
nRBC: 0 % (ref 0.0–0.2)
nRBC: 0 % (ref 0.0–0.2)

## 2022-06-11 LAB — TYPE AND SCREEN
ABO/RH(D): O POS
Antibody Screen: NEGATIVE

## 2022-06-11 LAB — GLUCOSE, CAPILLARY
Glucose-Capillary: 151 mg/dL — ABNORMAL HIGH (ref 70–99)
Glucose-Capillary: 98 mg/dL (ref 70–99)
Glucose-Capillary: 100 mg/dL — ABNORMAL HIGH (ref 70–99)

## 2022-06-11 LAB — RPR: RPR Ser Ql: NONREACTIVE

## 2022-06-11 MED ORDER — ACETAMINOPHEN 325 MG PO TABS: 650.0000 mg | ORAL_TABLET | ORAL | Status: DC | PRN

## 2022-06-11 MED ORDER — DIBUCAINE (PERIANAL) 1 % EX OINT: 1.0000 | TOPICAL_OINTMENT | CUTANEOUS | Status: DC | PRN

## 2022-06-11 MED ORDER — SENNOSIDES-DOCUSATE SODIUM 8.6-50 MG PO TABS
2.0000 | ORAL_TABLET | Freq: Every day | ORAL | Status: DC
  Administered 2022-06-12: 2 via ORAL
  Filled 2022-06-11: qty 2

## 2022-06-11 MED ORDER — DIPHENHYDRAMINE HCL 50 MG/ML IJ SOLN: 12.5000 mg | INTRAMUSCULAR | Status: DC | PRN

## 2022-06-11 MED ORDER — LIDOCAINE-EPINEPHRINE (PF) 2 %-1:200000 IJ SOLN
INTRAMUSCULAR | Status: DC | PRN
  Administered 2022-06-11: 5 mL via EPIDURAL

## 2022-06-11 MED ORDER — NIFEDIPINE ER OSMOTIC RELEASE 30 MG PO TB24
30.0000 mg | ORAL_TABLET | Freq: Every day | ORAL | Status: DC
  Administered 2022-06-11: 30 mg via ORAL
  Filled 2022-06-11: qty 1

## 2022-06-11 MED ORDER — ZOLPIDEM TARTRATE 5 MG PO TABS: 5.0000 mg | ORAL_TABLET | Freq: Every evening | ORAL | Status: DC | PRN

## 2022-06-11 MED ORDER — LIDOCAINE HCL (PF) 1 % IJ SOLN: 30.0000 mL | INTRAMUSCULAR | Status: DC | PRN

## 2022-06-11 MED ORDER — OXYCODONE HCL 5 MG PO TABS: 5.0000 mg | ORAL_TABLET | ORAL | Status: DC | PRN

## 2022-06-11 MED ORDER — DIPHENHYDRAMINE HCL 25 MG PO CAPS: 25.0000 mg | ORAL_CAPSULE | Freq: Four times a day (QID) | ORAL | Status: DC | PRN

## 2022-06-11 MED ORDER — ONDANSETRON HCL 4 MG/2ML IJ SOLN
4.0000 mg | Freq: Four times a day (QID) | INTRAMUSCULAR | Status: DC | PRN
  Administered 2022-06-11: 4 mg via INTRAVENOUS
  Filled 2022-06-11: qty 2

## 2022-06-11 MED ORDER — LABETALOL HCL 5 MG/ML IV SOLN
20.0000 mg | INTRAVENOUS | Status: DC | PRN
  Administered 2022-06-11: 20 mg via INTRAVENOUS

## 2022-06-11 MED ORDER — FENTANYL-BUPIVACAINE-NACL 0.5-0.125-0.9 MG/250ML-% EP SOLN
12.0000 mL/h | EPIDURAL | Status: DC | PRN
  Administered 2022-06-11: 12 mL/h via EPIDURAL
  Filled 2022-06-11: qty 250

## 2022-06-11 MED ORDER — OXYTOCIN-SODIUM CHLORIDE 30-0.9 UT/500ML-% IV SOLN
1.0000 m[IU]/min | INTRAVENOUS | Status: DC
  Administered 2022-06-11: 2 m[IU]/min via INTRAVENOUS
  Filled 2022-06-11: qty 500

## 2022-06-11 MED ORDER — PHENYLEPHRINE 80 MCG/ML (10ML) SYRINGE FOR IV PUSH (FOR BLOOD PRESSURE SUPPORT): 80.0000 ug | PREFILLED_SYRINGE | INTRAVENOUS | Status: DC | PRN

## 2022-06-11 MED ORDER — OXYTOCIN-SODIUM CHLORIDE 30-0.9 UT/500ML-% IV SOLN
2.5000 [IU]/h | INTRAVENOUS | Status: DC
  Administered 2022-06-11: 2.5 [IU]/h via INTRAVENOUS

## 2022-06-11 MED ORDER — OXYCODONE HCL 5 MG PO TABS: 10.0000 mg | ORAL_TABLET | ORAL | Status: DC | PRN

## 2022-06-11 MED ORDER — LABETALOL HCL 5 MG/ML IV SOLN
INTRAVENOUS | Status: AC
  Filled 2022-06-11: qty 4

## 2022-06-11 MED ORDER — HYDRALAZINE HCL 20 MG/ML IJ SOLN: 10.0000 mg | INTRAMUSCULAR | Status: DC | PRN

## 2022-06-11 MED ORDER — COCONUT OIL OIL: 1.0000 | TOPICAL_OIL | Status: DC | PRN

## 2022-06-11 MED ORDER — ONDANSETRON HCL 4 MG/2ML IJ SOLN: 4.0000 mg | INTRAMUSCULAR | Status: DC | PRN

## 2022-06-11 MED ORDER — METHYLERGONOVINE MALEATE 0.2 MG/ML IJ SOLN: 0.2000 mg | INTRAMUSCULAR | Status: DC | PRN

## 2022-06-11 MED ORDER — PHENYLEPHRINE 80 MCG/ML (10ML) SYRINGE FOR IV PUSH (FOR BLOOD PRESSURE SUPPORT)
80.0000 ug | PREFILLED_SYRINGE | INTRAVENOUS | Status: DC | PRN
  Filled 2022-06-11: qty 10

## 2022-06-11 MED ORDER — SOD CITRATE-CITRIC ACID 500-334 MG/5ML PO SOLN: 30.0000 mL | ORAL | Status: DC | PRN

## 2022-06-11 MED ORDER — LACTATED RINGERS IV SOLN: 500.0000 mL | Freq: Once | INTRAVENOUS | Status: DC

## 2022-06-11 MED ORDER — WITCH HAZEL-GLYCERIN EX PADS: 1.0000 | MEDICATED_PAD | CUTANEOUS | Status: DC | PRN

## 2022-06-11 MED ORDER — IBUPROFEN 600 MG PO TABS
600.0000 mg | ORAL_TABLET | Freq: Four times a day (QID) | ORAL | Status: DC
  Administered 2022-06-11 – 2022-06-12 (×4): 600 mg via ORAL
  Filled 2022-06-11 (×4): qty 1

## 2022-06-11 MED ORDER — LACTATED RINGERS IV SOLN: INTRAVENOUS | Status: DC

## 2022-06-11 MED ORDER — OXYCODONE-ACETAMINOPHEN 5-325 MG PO TABS: 2.0000 | ORAL_TABLET | ORAL | Status: DC | PRN

## 2022-06-11 MED ORDER — TERBUTALINE SULFATE 1 MG/ML IJ SOLN: 0.2500 mg | Freq: Once | INTRAMUSCULAR | Status: DC | PRN

## 2022-06-11 MED ORDER — BENZOCAINE-MENTHOL 20-0.5 % EX AERO: 1.0000 | INHALATION_SPRAY | CUTANEOUS | Status: DC | PRN

## 2022-06-11 MED ORDER — MAGNESIUM HYDROXIDE 400 MG/5ML PO SUSP: 30.0000 mL | ORAL | Status: DC | PRN

## 2022-06-11 MED ORDER — ONDANSETRON HCL 4 MG PO TABS: 4.0000 mg | ORAL_TABLET | ORAL | Status: DC | PRN

## 2022-06-11 MED ORDER — SIMETHICONE 80 MG PO CHEW: 80.0000 mg | CHEWABLE_TABLET | ORAL | Status: DC | PRN

## 2022-06-11 MED ORDER — OXYCODONE-ACETAMINOPHEN 5-325 MG PO TABS: 1.0000 | ORAL_TABLET | ORAL | Status: DC | PRN

## 2022-06-11 MED ORDER — OXYTOCIN BOLUS FROM INFUSION
333.0000 mL | Freq: Once | INTRAVENOUS | Status: AC
  Administered 2022-06-11: 333 mL via INTRAVENOUS

## 2022-06-11 MED ORDER — EPHEDRINE 5 MG/ML INJ: 10.0000 mg | INTRAVENOUS | Status: DC | PRN

## 2022-06-11 MED ORDER — MEASLES, MUMPS & RUBELLA VAC IJ SOLR: 0.5000 mL | Freq: Once | INTRAMUSCULAR | Status: DC

## 2022-06-11 MED ORDER — PRENATAL MULTIVITAMIN CH
1.0000 | ORAL_TABLET | Freq: Every day | ORAL | Status: DC
  Administered 2022-06-12: 1 via ORAL
  Filled 2022-06-11: qty 1

## 2022-06-11 MED ORDER — METHYLERGONOVINE MALEATE 0.2 MG PO TABS: 0.2000 mg | ORAL_TABLET | ORAL | Status: DC | PRN

## 2022-06-11 MED ORDER — LABETALOL HCL 5 MG/ML IV SOLN: 80.0000 mg | INTRAVENOUS | Status: DC | PRN

## 2022-06-11 MED ORDER — LABETALOL HCL 5 MG/ML IV SOLN: 40.0000 mg | INTRAVENOUS | Status: DC | PRN

## 2022-06-11 MED ORDER — LACTATED RINGERS IV SOLN: 500.0000 mL | INTRAVENOUS | Status: DC | PRN

## 2022-06-11 MED ORDER — TETANUS-DIPHTH-ACELL PERTUSSIS 5-2.5-18.5 LF-MCG/0.5 IM SUSY: 0.5000 mL | PREFILLED_SYRINGE | Freq: Once | INTRAMUSCULAR | Status: DC

## 2022-06-11 NOTE — Progress Notes (Signed)
Comfortable with epidural Afeb, VSS, BP labile and has required IV labetalol FHT-140, Cat 1, ctx q 2-3 min VE-4/50/-2, vtx, AROM clear CBG 151, 98 Continue pitocin and monitor progress, monitor BP and treat prn, continue CBG q 2 hrs

## 2022-06-11 NOTE — Progress Notes (Addendum)
Called MD per BP policy.  No new orders given by MD and no change to VS timing,  will reassess BP in 4 hours per routine postpartum orders.

## 2022-06-11 NOTE — Anesthesia Procedure Notes (Signed)
Epidural Patient location during procedure: OB Start time: 06/11/2022 9:54 AM End time: 06/11/2022 10:00 AM  Staffing Anesthesiologist: Shelton Silvas, MD Performed: anesthesiologist   Preanesthetic Checklist Completed: patient identified, IV checked, site marked, risks and benefits discussed, surgical consent, monitors and equipment checked, pre-op evaluation and timeout performed  Epidural Patient position: sitting Prep: DuraPrep Patient monitoring: heart rate, continuous pulse ox and blood pressure Approach: midline Location: L3-L4 Injection technique: LOR saline  Needle:  Needle type: Tuohy  Needle gauge: 17 G Needle length: 9 cm Catheter type: closed end flexible Catheter size: 20 Guage Test dose: negative and 1.5% lidocaine  Assessment Events: blood not aspirated, no cerebrospinal fluid, injection not painful, no injection resistance and no paresthesia  Additional Notes LOR @ 7  Patient identified. Risks/Benefits/Options discussed with patient including but not limited to bleeding, infection, nerve damage, paralysis, failed block, incomplete pain control, headache, blood pressure changes, nausea, vomiting, reactions to medications, itching and postpartum back pain. Confirmed with bedside nurse the patient's most recent platelet count. Confirmed with patient that they are not currently taking any anticoagulation, have any bleeding history or any family history of bleeding disorders. Patient expressed understanding and wished to proceed. All questions were answered. Sterile technique was used throughout the entire procedure. Please see nursing notes for vital signs. Test dose was given through epidural catheter and negative prior to continuing to dose epidural or start infusion. Warning signs of high block given to the patient including shortness of breath, tingling/numbness in hands, complete motor block, or any concerning symptoms with instructions to call for help. Patient was  given instructions on fall risk and not to get out of bed. All questions and concerns addressed with instructions to call with any issues or inadequate analgesia.    Reason for block:procedure for pain

## 2022-06-11 NOTE — H&P (Signed)
Carrie Haley is a 37 y.o. female, G2 P1001, EGA 38+ weeks with EDC 4-28 presenting for induction for probable CHTN and A2GDM.  Developed HTN early, but did not require meds until 35 weeks, then controlled with Procardia XL 30 mg.  A2 GDM controlled with metformin 1000mg  q pm.  Reassuring antenatal testing.  OB History     Gravida  2   Para  1   Term  1   Preterm  0   AB  0   Living  1      SAB  0   IAB  0   Ectopic  0   Multiple  0   Live Births  1          Past Medical History:  Diagnosis Date   Depression    Gestational diabetes    Pregnancy induced hypertension    Past Surgical History:  Procedure Laterality Date   ABDOMINOPLASTY  2015   Family History: family history includes Asthma in her father; Cancer in her father; Diabetes in her father and mother; Hepatitis in her father; Hypertension in her mother. Social History:  reports that she has quit smoking. Her smoking use included cigarettes. She has a 0.75 pack-year smoking history. She has never used smokeless tobacco. She reports that she does not currently use alcohol. She reports that she does not use drugs.     Maternal Diabetes: Yes:  Diabetes Type:  Insulin/Medication controlled Genetic Screening: Declined Maternal Ultrasounds/Referrals: Normal Fetal Ultrasounds or other Referrals:  None Maternal Substance Abuse:  No Significant Maternal Medications:  Meds include: Other:  Significant Maternal Lab Results:  Group B Strep negative Number of Prenatal Visits:greater than 3 verified prenatal visits Other Comments:   Metformin, Procardia  Review of Systems  Respiratory: Negative.    Cardiovascular: Negative.    Maternal Medical History:  Fetal activity: Perceived fetal activity is normal.   Prenatal complications: PIH.   Prenatal Complications - Diabetes: gestational. Diabetes is managed by oral agent (monotherapy).     Dilation: 2.5 Effacement (%): 70 Station: -2 Exam by::  Davis,RN Blood pressure 116/74, pulse 78, temperature 98.6 F (37 C), temperature source Oral, resp. rate 16, height 5\' 6"  (1.676 m), weight 122.3 kg, last menstrual period 09/17/2021, unknown if currently breastfeeding. Maternal Exam:  Uterine Assessment: Contraction strength is mild.  Contraction frequency is irregular.  Abdomen: Patient reports no abdominal tenderness. Estimated fetal weight is 7.5 lbs.   Fetal presentation: vertex Introitus: Normal vulva. Normal vagina.  Amniotic fluid character: not assessed. Pelvis: adequate for delivery.     Fetal Exam Fetal Monitor Review: Mode: ultrasound.   Baseline rate: 130-140.  Variability: moderate (6-25 bpm).   Pattern: accelerations present and no decelerations.   Fetal State Assessment: Category I - tracings are normal.   Physical Exam Vitals reviewed.  Constitutional:      Appearance: She is obese.  Cardiovascular:     Rate and Rhythm: Normal rate and regular rhythm.  Pulmonary:     Effort: Pulmonary effort is normal. No respiratory distress.  Abdominal:     Palpations: Abdomen is soft.  Genitourinary:    General: Normal vulva.     Prenatal labs: ABO, Rh: --/--/O POS (04/15 3846) Antibody: NEG (04/15 6599) Rubella: Immune (09/25 0000) RPR: Nonreactive (09/25 0000)  HBsAg: Negative (09/25 0000)  HIV: Non-reactive (09/25 0000)  GBS: Negative/-- (04/04 0000)   Assessment/Plan: IUP at 38+ weeks with CHTN, A2GDM, for induction.  Pitocin started, will monitor progress, plan  AROM when more dilated.  Follow CBG q 2 hrs and monitor BP and treat prn-given her Procardia this am   Zenaida Niece 06/11/2022, 9:11 AM

## 2022-06-11 NOTE — Anesthesia Preprocedure Evaluation (Signed)
Anesthesia Evaluation  Patient identified by MRN, date of birth, ID band Patient awake    Reviewed: Allergy & Precautions, Patient's Chart, lab work & pertinent test results  Airway Mallampati: III  TM Distance: >3 FB Neck ROM: Full    Dental  (+) Teeth Intact   Pulmonary former smoker   breath sounds clear to auscultation       Cardiovascular hypertension, Pt. on medications  Rhythm:Regular Rate:Normal     Neuro/Psych  PSYCHIATRIC DISORDERS  Depression       GI/Hepatic negative GI ROS,,,  Endo/Other  diabetes, Gestational, Oral Hypoglycemic Agents    Renal/GU      Musculoskeletal   Abdominal   Peds  Hematology   Anesthesia Other Findings   Reproductive/Obstetrics                             Anesthesia Physical Anesthesia Plan  ASA: 3  Anesthesia Plan: Epidural   Post-op Pain Management: Minimal or no pain anticipated   Induction:   PONV Risk Score and Plan: 0  Airway Management Planned: Natural Airway  Additional Equipment: None  Intra-op Plan:   Post-operative Plan:   Informed Consent: I have reviewed the patients History and Physical, chart, labs and discussed the procedure including the risks, benefits and alternatives for the proposed anesthesia with the patient or authorized representative who has indicated his/her understanding and acceptance.       Plan Discussed with:   Anesthesia Plan Comments: (Lab Results      Component                Value               Date                      WBC                      5.9                 06/11/2022                HGB                      13.7                06/11/2022                HCT                      42.0                06/11/2022                MCV                      82.5                06/11/2022                PLT                      437 (H)             06/11/2022           )       Anesthesia Quick  Evaluation

## 2022-06-12 NOTE — Social Work (Signed)
MOB was referred for history of depression/anxiety.  * Referral screened out by Clinical Social Worker because none of the following criteria appear to apply:  ~ History of anxiety/depression during this pregnancy, or of post-partum depression following prior delivery.  ~ Diagnosis of anxiety and/or depression within last 3 years OR * MOB's symptoms currently being treated with medication and/or therapy.  Per chart review MOB was diagnosis dates back to 2012, No noted concerns during this pregnancy.   Please contact the Clinical Social Worker if needs arise, or by MOB request.   Wende Neighbors, LCSWA Clinical Social Worker [phone]

## 2022-06-12 NOTE — Anesthesia Postprocedure Evaluation (Signed)
Anesthesia Post Note  Patient: Carrie Haley  Procedure(s) Performed: AN AD HOC LABOR EPIDURAL     Patient location during evaluation: Mother Baby Anesthesia Type: Epidural Level of consciousness: awake, oriented and awake and alert Pain management: pain level controlled Vital Signs Assessment: post-procedure vital signs reviewed and stable Respiratory status: spontaneous breathing, respiratory function stable and nonlabored ventilation Cardiovascular status: stable Postop Assessment: no headache, adequate PO intake, able to ambulate, patient able to bend at knees and no apparent nausea or vomiting Anesthetic complications: no   No notable events documented.  Last Vitals:  Vitals:   06/12/22 0227 06/12/22 0630  BP: 112/67 119/70  Pulse: 79 80  Resp: 18 18  Temp: 36.8 C 36.6 C  SpO2: 100% 99%    Last Pain:  Vitals:   06/12/22 0630  TempSrc: Oral  PainSc: 0-No pain   Pain Goal:                   Khiyan Crace

## 2022-06-12 NOTE — Progress Notes (Signed)
BPs remained mild range throughout the day.  Patient still strongly desires discharge to home today  Vitals:   06/12/22 0630 06/12/22 0917 06/12/22 1252 06/12/22 1509  BP: 119/70 134/80 131/89 139/84  Pulse: 80 90 83 86  Resp: Temp: 97.9 F (36.6 C) 98.4 F (36.9 C) 98.9 F (37.2 C) 98.4 F (36.9 C)  TempSrc: Oral Oral Oral Oral  SpO2: 99% 100% 98% 100%  Weight:      Height:       Given elevated BPs in labor, I recommend staying overnight.  She requests discharge to home and is able to monitor BP at home.  Agrees to BP check in office in 2-3 days.  Warning signs reviewed.  Will call if bps elevated at home

## 2022-06-12 NOTE — Discharge Summary (Signed)
Postpartum Discharge Summary  Date of Service updated 06/12/22     Patient Name: Carrie Haley DOB: 03-08-1985 MRN: [id number]  Date of admission: 06/11/2022 Delivery date:06/11/2022  Delivering provider: Jackelyn Knife, TODD  Date of discharge: 06/12/2022  Admitting diagnosis: Chronic hypertension in pregnancy [O10.919] Intrauterine pregnancy: [redacted]w[redacted]d     Secondary diagnosis:  Principal Problem:   Chronic hypertension in pregnancy  Additional problems: GDMA2    Discharge diagnosis: Preterm Pregnancy Delivered, CHTN, and GDM A2                                              Post partum procedures: n/a Augmentation: AROM and Pitocin Complications: None  Hospital course: Induction of Labor With Vaginal Delivery   37 y.o. yo G2P2002 at [redacted]w[redacted]d was admitted to the hospital 06/11/2022 for induction of labor.  Indication for induction: A2 DM and CHTN .  Patient had an labor course complicated by elevated BP.  Membrane Rupture Time/Date: 12:11 PM ,06/11/2022   Delivery Method:Vaginal, Spontaneous  Episiotomy: None  Lacerations:  None  Details of delivery can be found in separate delivery note.  Patient had a postpartum course complicated by n/a.  She was on procardia Xl  daily (started at 35 weeks).  This was discontinued postpartum.  Her BPs quickly trended down to the normal range following delivery.  On PPD#1 she requested a 24 hour discharge.  It was recommended that she stay until PPD#2, but she felt strongly about discharge.  BPs remained normal during the day and she was asymptomatic, so discharge was placed with the plan to monitor BP with home cuff (already available) and f/u in office in 2 days for BP check. Patient is discharged home 06/12/22.  Newborn Data: Birth date:06/11/2022  Birth time:3:19 PM  Gender:Female  Living status:Living  Apgars:9 ,9  Weight:3160 g   Magnesium Sulfate received: No    Physical exam  Vitals:   06/12/22 0630 06/12/22 0917 06/12/22 1252 06/12/22  1509  BP: 119/70 134/80 131/89 139/84  Pulse: 80 90 83 86  Resp: Temp: 97.9 F (36.6 C) 98.4 F (36.9 C) 98.9 F (37.2 C) 98.4 F (36.9 C)  TempSrc: Oral Oral Oral Oral  SpO2: 99% 100% 98% 100%  Weight:      Height:       General: alert, cooperative, and no distress Lochia: appropriate Uterine Fundus: firm Incision: N/A DVT Evaluation: No evidence of DVT seen on physical exam. Labs: Lab Results  Component Value Date   WBC 11.2 (H) 06/11/2022   HGB 14.2 06/11/2022   HCT 43.2 06/11/2022   MCV 83.9 06/11/2022   PLT 396 06/11/2022      Latest Ref Rng & Units 05/17/2022    2:41 PM  CMP  Glucose 70 - 99 mg/dL 88   BUN 6 - 20 mg/dL 5   Creatinine 4.09 - 8.11 mg/dL 9.14   Sodium 782 - 956 mmol/L 132   Potassium 3.5 - 5.1 mmol/L 3.7   Chloride 98 - 111 mmol/L 100   CO2 22 - 32 mmol/L 22   Calcium 8.9 - 10.3 mg/dL 8.6   Total Protein 6.5 - 8.1 g/dL 6.3   Total Bilirubin 0.3 - 1.2 mg/dL <2.1   Alkaline Phos 38 - 126 U/L 117   AST 15 - 41 U/L 15   ALT 0 -  44 U/L 14    Edinburgh Score:    06/11/2022   11:10 PM  Edinburgh Postnatal Depression Scale Screening Tool  I have been able to laugh and see the funny side of things. 0  I have looked forward with enjoyment to things. 0  I have blamed myself unnecessarily when things went wrong. 0  I have been anxious or worried for no good reason. 2  I have felt scared or panicky for no good reason. 0  Things have been getting on top of me. 0  I have been so unhappy that I have had difficulty sleeping. 0  I have felt sad or miserable. 0  I have been so unhappy that I have been crying. 0  The thought of harming myself has occurred to me. 0  Edinburgh Postnatal Depression Scale Total 2      After visit meds:  Allergies as of 06/12/2022   No Known Allergies      Medication List     STOP taking these medications    aspirin EC 81 MG tablet   lidocaine 5 % Commonly known as: Lidoderm   metformin 500 MG  (OSM) 24 hr tablet Commonly known as: FORTAMET   methocarbamol 500 MG tablet Commonly known as: ROBAXIN   NIFEdipine 30 MG 24 hr tablet Commonly known as: PROCARDIA-XL/NIFEDICAL-XL       TAKE these medications    prenatal multivitamin Tabs tablet Take 1 tablet by mouth daily at 12 noon.         Discharge home in stable condition Infant Feeding: Breast Infant Disposition:home with mother Discharge instruction: per After Visit Summary and Postpartum booklet. Activity: Advance as tolerated. Pelvic rest for 6 weeks.  Diet: routine diet Anticipated Birth Control: Unsure Postpartum Appointment:6 weeks Additional Postpartum F/U: BP check 2-3 days Future Appointments:No future appointments. Follow up Visit:  Follow-up Information     Meisinger, Tawanna Cooler, MD Follow up.   Specialty: Obstetrics and Gynecology Why: BP check Contact information: [address], [address] [place] [address] [phone]                     06/12/2022 [facility] GEFFEL Chestine Spore, MD

## 2022-06-12 NOTE — Lactation Note (Signed)
This note was copied from a baby's chart. Lactation Consultation Note  Patient Name: Carrie Haley [id number] Date: 06/12/2022 Age:35 hours Reason for consult: Initial assessment;Early term 5-38.6wks Mom stated that she is going to pump and bottle feed. RN had set up DEBP for mom. Mom has pumped and bottle fed her 1st child so she has no questions or concerns. Mom holding baby STS. Milk storage reviewed. Encouraged mom to call for assistance as needed.  Maternal Data    Feeding    LATCH Score                    Lactation Tools Discussed/Used Tools: Pump Breast pump type: Double-Electric Breast Pump Reason for Pumping: pump and bottle feeding  Interventions Interventions: Breast feeding basics reviewed;LC Services brochure  Discharge    Consult Status Consult Status: Complete Date: 06/12/22    Carrie Haley 06/12/2022, 5:26 AM

## 2022-06-12 NOTE — Progress Notes (Signed)
Patient is doing well.  She is ambulating, voiding, tolerating PO.  Pain control is good.  Lochia is appropriate  Vitals:   06/11/22 1841 06/11/22 2241 06/12/22 0227 06/12/22 0630  BP: 126/88 123/74 112/67 119/70  Pulse: 73 75 79 80  Resp: Temp: 98.5 F (36.9 C) 98.5 F (36.9 C) 98.3 F (36.8 C) 97.9 F (36.6 C)  TempSrc: Oral Oral Oral Oral  SpO2:  98% 100% 99%  Weight:      Height:        NAD Fundus firm Ext: no edema  Lab Results  Component Value Date   WBC 11.2 (H) 06/11/2022   HGB 14.2 06/11/2022   HCT 43.2 06/11/2022   MCV 83.9 06/11/2022   PLT 396 06/11/2022    --/--/O POS (04/15 1610)  A/P 37 y.o. [id number] PPD#1 s/p IOL for CHTN at term. Routine care.   CHTN:  Was on procardia XL  daily (started at 35 weeks).  BPs were quite elevated during IOL 140-170s/80-90s.  Mild range since epidural placement and normal since delivery.  Procardia was discontinued with delivery.  I recommend close monitoring of BPs today with discharge tomorrow given the elevated BPs in labor yesterday.  Patient strongly desires discharge to home today.  We discussed the risks of PP HTN.  She is feeling well without any LE edema.  She has a BP cuff at home.  We will check q3 vital signs today and re-evaluate this afternoon.     [facility] GEFFEL [employer]

## 2022-06-19 IMAGING — CT CT CERVICAL SPINE W/O CM
3 of 4 series · 11 of 35 positions shown, 13 images · non-contrast
Comparison: Head CT 09/15/2007

CLINICAL DATA: Head and neck trauma.



[Series 5: cor bone · coronal · 0.33mm/px · 3 of 66 slices shown]
[im 18/66  bone]
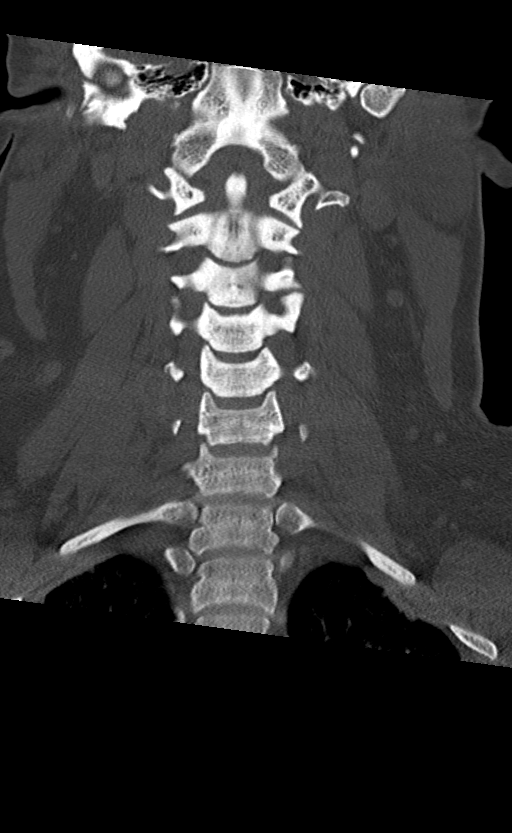
[im 28/66  bone]
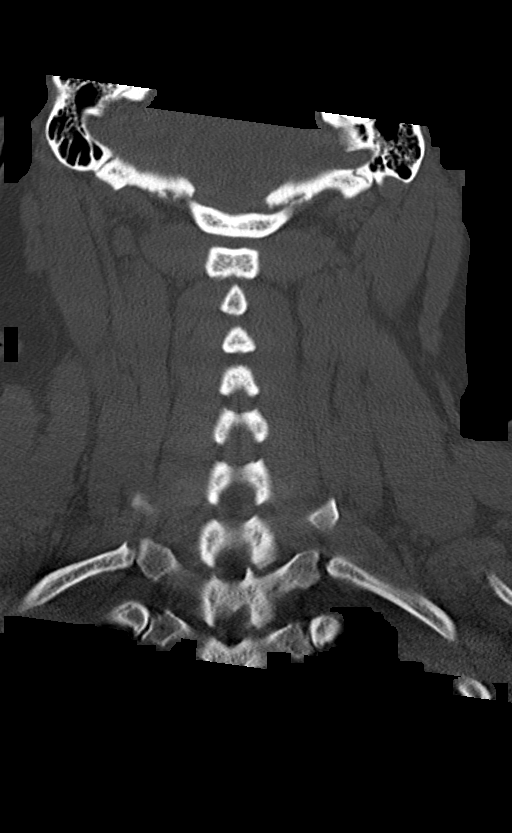
[im 38/66  bone]
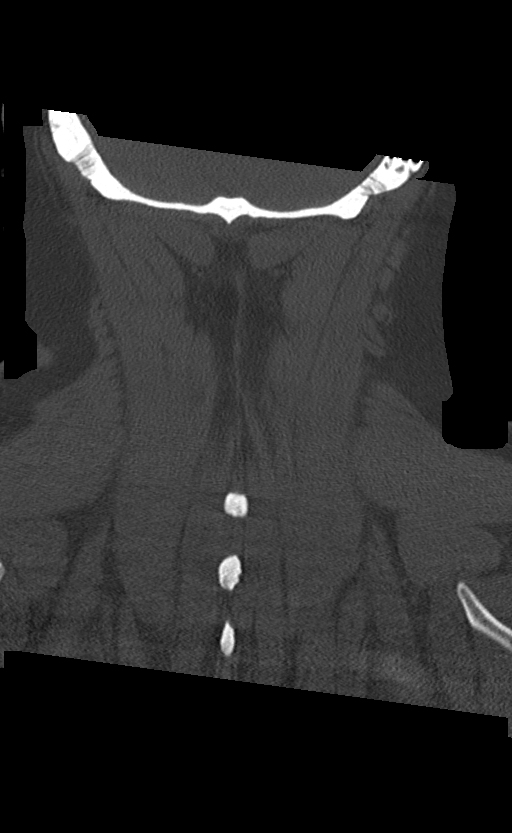

[Series 6: sag bone · sagittal · 0.27mm/px · 5 of 54 slices shown, 6 images]
[im 18/54  bone]
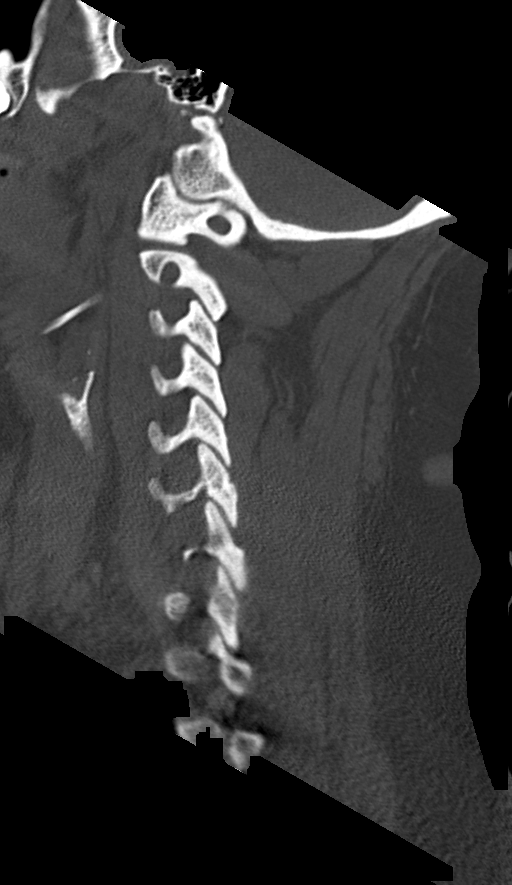
[im 23/54  bone]
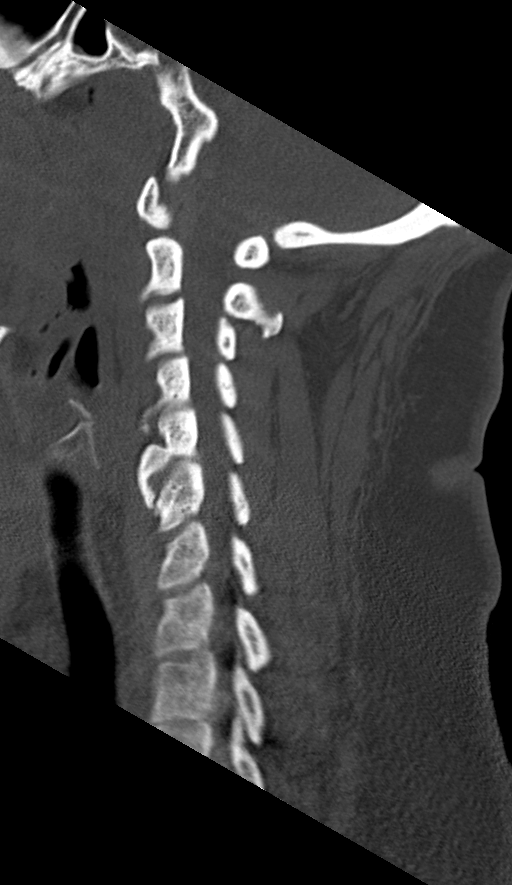
[im 27/54  soft-tissue]
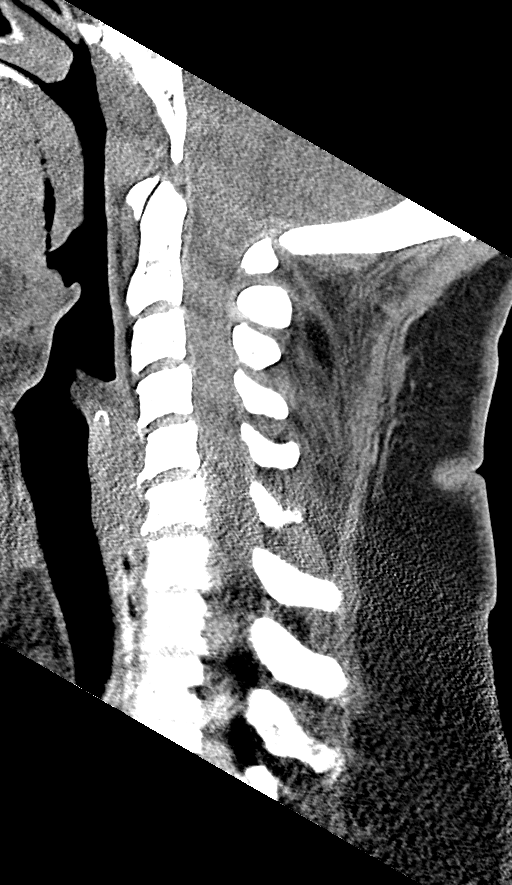
[im 27/54  bone]
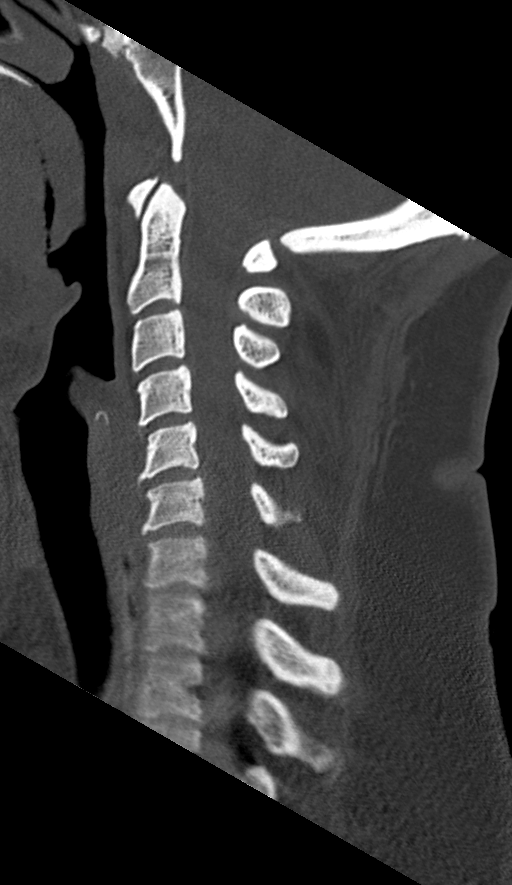
[im 31/54  bone]
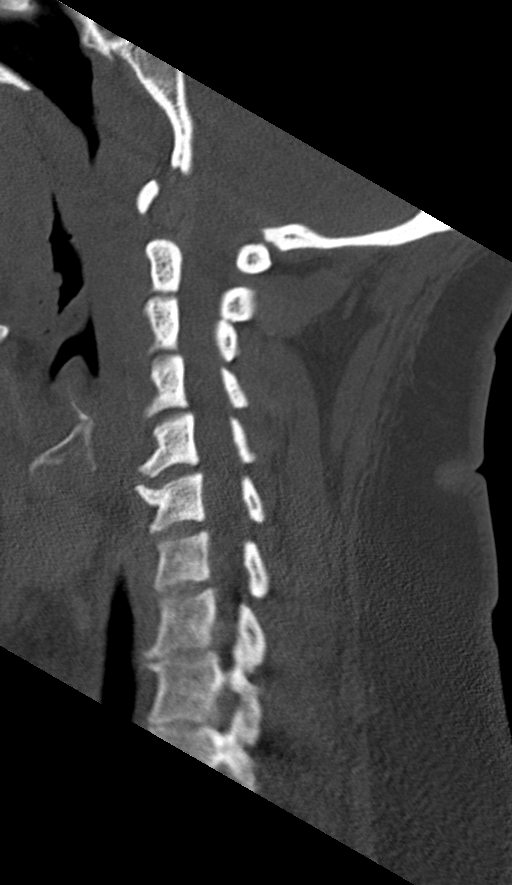
[im 36/54  bone]
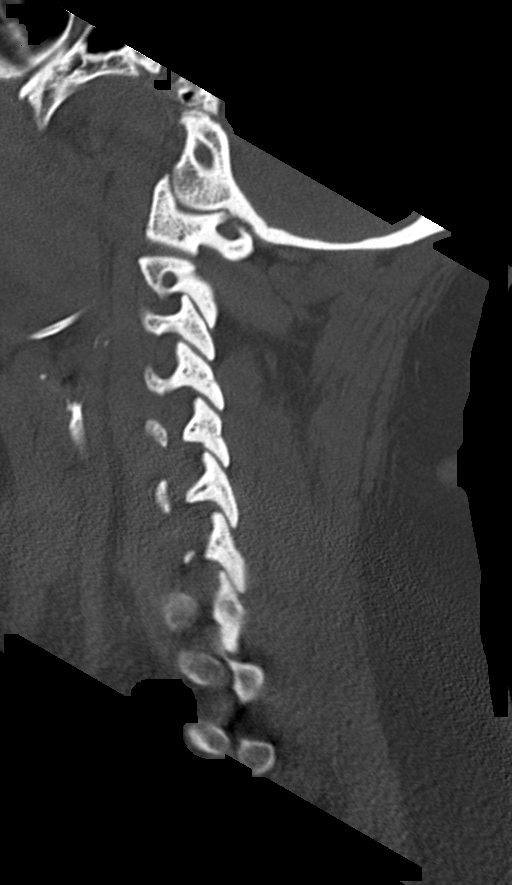

[Series 7: orthogonal axials · axial · 0.21mm/px · z∈[-407,-315]mm · 3 of 90 slices shown, 4 images]
[im 15/90  soft-tissue]
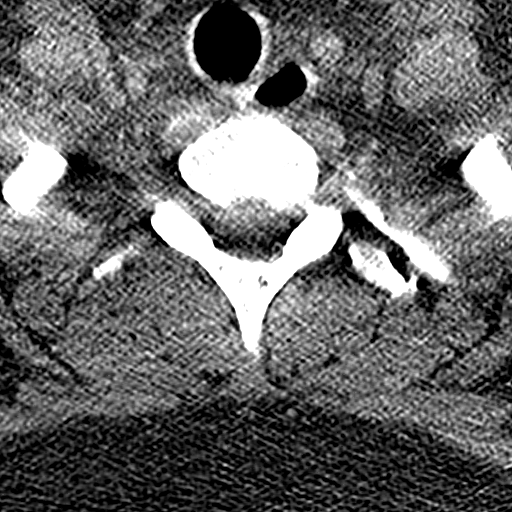
[im 15/90  bone]
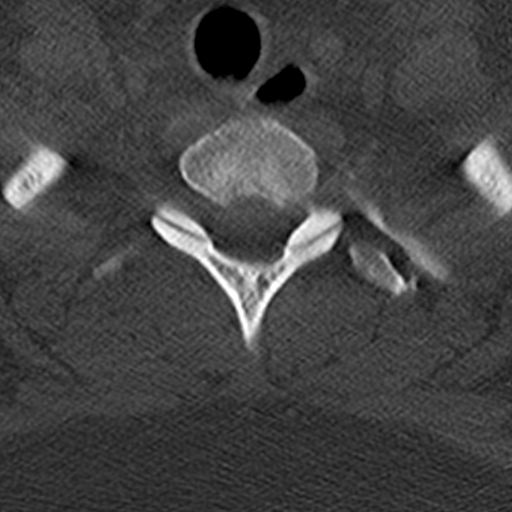
[im 45/90  bone]
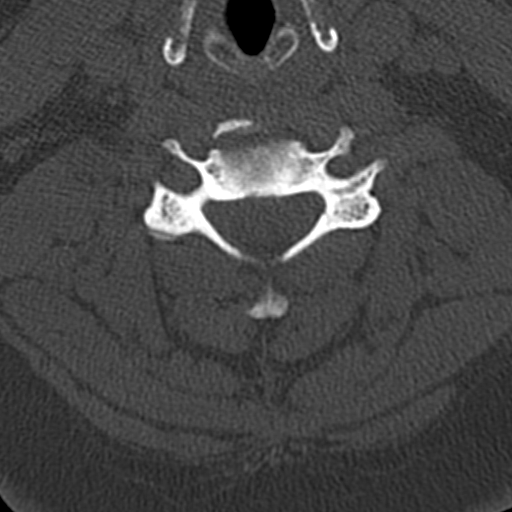
[im 75/90  bone]
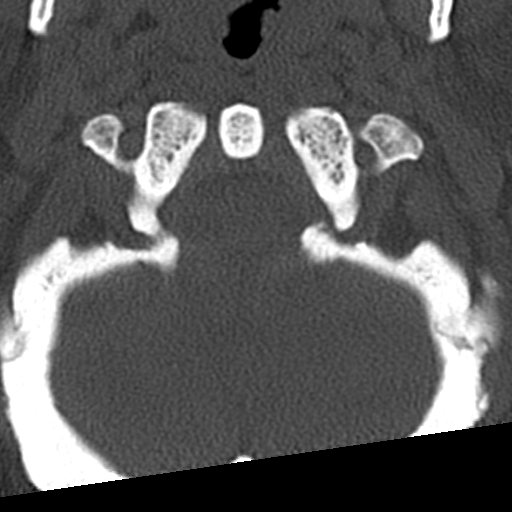

[11 of 35 positions shown; findings below may reference images not displayed]

FINDINGS: CT HEAD FINDINGS

Brain: There is no evidence for acute hemorrhage, hydrocephalus,
mass lesion, or abnormal extra-axial fluid collection. No definite
CT evidence for acute infarction.

Vascular: No hyperdense vessel or unexpected calcification.

Skull: No evidence for fracture. No worrisome lytic or sclerotic
lesion.

Sinuses/Orbits: The visualized paranasal sinuses and mastoid air
cells are clear. Visualized portions of the globes and intraorbital
fat are unremarkable.

Other: None.

CT CERVICAL SPINE FINDINGS

Alignment: Straightening of normal cervical lordosis. No
subluxation.

Skull base and vertebrae: No acute fracture. No primary bone lesion
or focal pathologic process.

Soft tissues and spinal canal: No prevertebral fluid or swelling. No
visible canal hematoma.

Disc levels:  Disc height is preserved throughout.

Upper chest: Negative.

Other: None.
IMPRESSION: 1. No acute intracranial abnormality.
2. No cervical spine fracture or subluxation.

## 2022-06-23 ENCOUNTER — Telehealth (HOSPITAL_COMMUNITY): Payer: Self-pay

## 2022-06-23 NOTE — Telephone Encounter (Signed)
Patient did not answer phone call. Voicemail left for patient.   Carrie Haley [facility] Perinatal [facility]   06/23/22,1524
